# Patient Record
Sex: Female | Born: 1955 | Race: Black or African American | Hispanic: No | Smoking: Never smoker
Health system: Southern US, Community
[De-identification: ages and names within clinical notes are randomized; demographics above are authoritative.]

## PROBLEM LIST (undated history)

## (undated) DIAGNOSIS — E119 Type 2 diabetes mellitus without complications: Secondary | ICD-10-CM

## (undated) DIAGNOSIS — I10 Essential (primary) hypertension: Secondary | ICD-10-CM

## (undated) DIAGNOSIS — E785 Hyperlipidemia, unspecified: Secondary | ICD-10-CM

## (undated) DIAGNOSIS — I219 Acute myocardial infarction, unspecified: Secondary | ICD-10-CM

## (undated) DIAGNOSIS — N289 Disorder of kidney and ureter, unspecified: Secondary | ICD-10-CM

## (undated) HISTORY — PX: CARDIAC SURGERY: SHX584

---

## 2009-06-07 ENCOUNTER — Inpatient Hospital Stay (HOSPITAL_COMMUNITY): Admission: EM | Admit: 2009-06-07 | Discharge: 2009-06-15 | Payer: Self-pay | Admitting: Emergency Medicine

## 2009-06-07 ENCOUNTER — Encounter: Payer: Self-pay | Admitting: Cardiovascular Disease

## 2009-06-07 ENCOUNTER — Ambulatory Visit: Payer: Self-pay | Admitting: Cardiovascular Disease

## 2009-06-09 ENCOUNTER — Ambulatory Visit: Payer: Self-pay | Admitting: Cardiothoracic Surgery

## 2009-06-09 ENCOUNTER — Encounter: Payer: Self-pay | Admitting: Cardiothoracic Surgery

## 2009-06-09 ENCOUNTER — Encounter: Payer: Self-pay | Admitting: Cardiovascular Disease

## 2009-06-10 ENCOUNTER — Encounter: Payer: Self-pay | Admitting: Cardiothoracic Surgery

## 2009-06-16 ENCOUNTER — Encounter: Payer: Self-pay | Admitting: Cardiovascular Disease

## 2009-07-09 ENCOUNTER — Encounter: Admission: RE | Admit: 2009-07-09 | Discharge: 2009-07-09 | Payer: Self-pay | Admitting: Cardiothoracic Surgery

## 2009-07-09 ENCOUNTER — Ambulatory Visit: Payer: Self-pay | Admitting: Cardiothoracic Surgery

## 2009-07-09 ENCOUNTER — Encounter: Payer: Self-pay | Admitting: Cardiovascular Disease

## 2009-07-21 DIAGNOSIS — I2510 Atherosclerotic heart disease of native coronary artery without angina pectoris: Secondary | ICD-10-CM | POA: Insufficient documentation

## 2009-07-22 ENCOUNTER — Encounter: Payer: Self-pay | Admitting: Physician Assistant

## 2009-07-22 ENCOUNTER — Ambulatory Visit: Payer: Self-pay | Admitting: Internal Medicine

## 2009-07-22 DIAGNOSIS — D500 Iron deficiency anemia secondary to blood loss (chronic): Secondary | ICD-10-CM

## 2009-07-22 DIAGNOSIS — I2589 Other forms of chronic ischemic heart disease: Secondary | ICD-10-CM | POA: Insufficient documentation

## 2009-07-22 DIAGNOSIS — E119 Type 2 diabetes mellitus without complications: Secondary | ICD-10-CM

## 2009-07-23 ENCOUNTER — Telehealth: Payer: Self-pay | Admitting: Cardiovascular Disease

## 2009-07-27 ENCOUNTER — Encounter (HOSPITAL_COMMUNITY): Admission: RE | Admit: 2009-07-27 | Discharge: 2009-08-26 | Payer: Self-pay | Admitting: Cardiovascular Disease

## 2009-07-31 ENCOUNTER — Emergency Department (HOSPITAL_COMMUNITY): Admission: EM | Admit: 2009-07-31 | Discharge: 2009-07-31 | Payer: Self-pay | Admitting: Emergency Medicine

## 2009-08-03 ENCOUNTER — Telehealth: Payer: Self-pay | Admitting: Cardiovascular Disease

## 2009-08-06 ENCOUNTER — Telehealth: Payer: Self-pay | Admitting: Cardiovascular Disease

## 2009-08-13 ENCOUNTER — Telehealth: Payer: Self-pay | Admitting: Cardiovascular Disease

## 2009-09-04 ENCOUNTER — Encounter: Payer: Self-pay | Admitting: Cardiovascular Disease

## 2009-09-24 ENCOUNTER — Ambulatory Visit (HOSPITAL_COMMUNITY): Admission: RE | Admit: 2009-09-24 | Discharge: 2009-09-24 | Payer: Self-pay | Admitting: Cardiovascular Disease

## 2009-09-24 ENCOUNTER — Ambulatory Visit: Payer: Self-pay | Admitting: Cardiology

## 2009-09-24 ENCOUNTER — Ambulatory Visit: Payer: Self-pay

## 2009-09-24 ENCOUNTER — Ambulatory Visit: Payer: Self-pay | Admitting: Cardiovascular Disease

## 2009-09-24 ENCOUNTER — Encounter: Payer: Self-pay | Admitting: Cardiovascular Disease

## 2010-01-07 ENCOUNTER — Telehealth: Payer: Self-pay | Admitting: Cardiovascular Disease

## 2010-07-15 ENCOUNTER — Emergency Department (HOSPITAL_COMMUNITY): Admission: EM | Admit: 2010-07-15 | Discharge: 2010-07-15 | Payer: Self-pay | Admitting: Emergency Medicine

## 2010-10-16 IMAGING — CR DG CHEST 1V PORT
1 series · 1 of 1 positions shown · non-contrast
Comparison: 06/07/2009

CLINICAL DATA: Respiratory distress.

PORTABLE CHEST - 1 VIEW

[view not recorded]
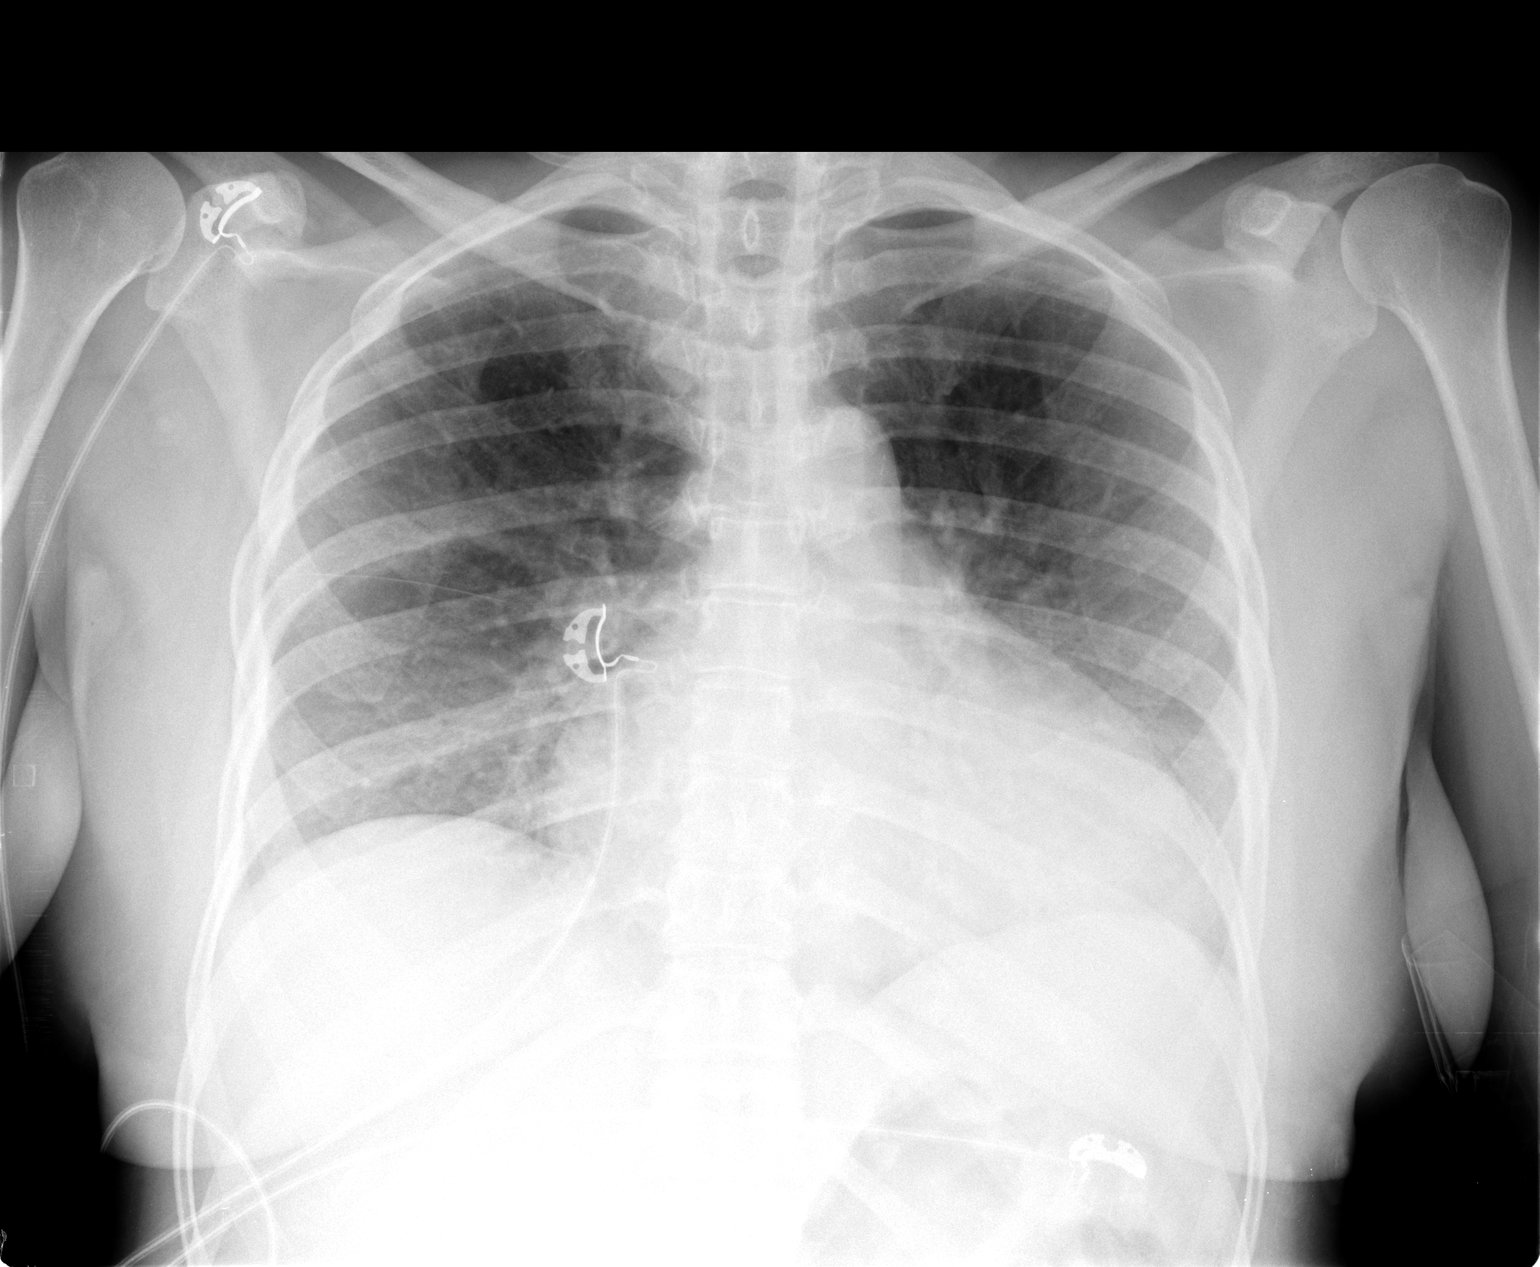

[1 of 1 positions shown; findings below may reference images not displayed]

FINDINGS: There is cardiomegaly with vascular congestion.
Improving airspace disease, likely reflecting improving edema.  No
confluent opacities or effusions.  No acute bony abnormality.
IMPRESSION: Significant improvement in bilateral airspace disease, likely
improving edema.

## 2010-10-18 IMAGING — CR DG CHEST 1V PORT
1 series · 1 of 1 positions shown · non-contrast
Comparison: 06/09/2009

CLINICAL DATA: Post CABG/respiratory distress

PORTABLE CHEST - 1 VIEW

[AP]
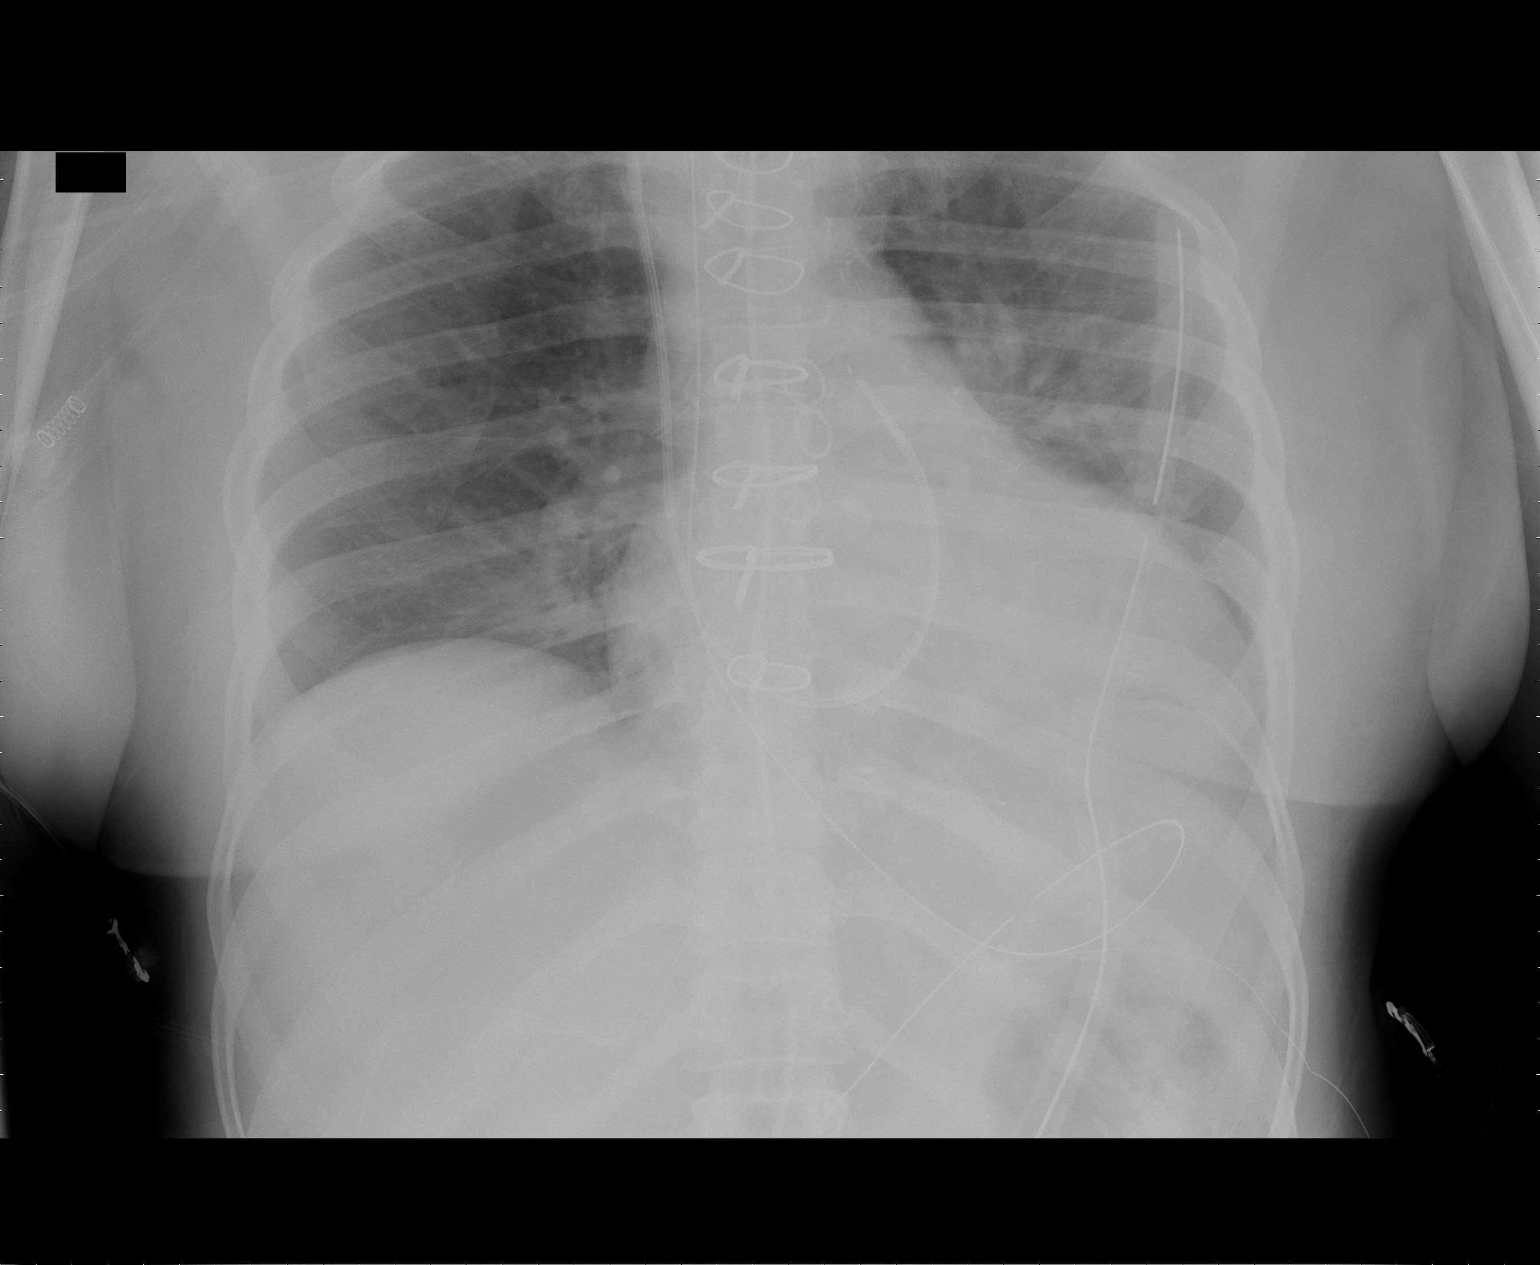

[1 of 1 positions shown; findings below may reference images not displayed]

FINDINGS: Post CABG with customary tubes and lines in place,
including a Swan-Ganz catheter that is positioned in the main
pulmonary artery.

No pneumothorax, pulmonary edema, or major atelectasis.  There is
mild postoperative basilar atelectasis.
IMPRESSION: Overall satisfactory postoperative chest x-ray with mild bibasilar
atelectasis.

## 2010-10-19 IMAGING — CR DG CHEST 1V PORT
1 series · 1 of 1 positions shown · non-contrast
Comparison: 1 day prior

CLINICAL DATA: CABG.  Respiratory distress.

PORTABLE CHEST - 1 VIEW

[AP]
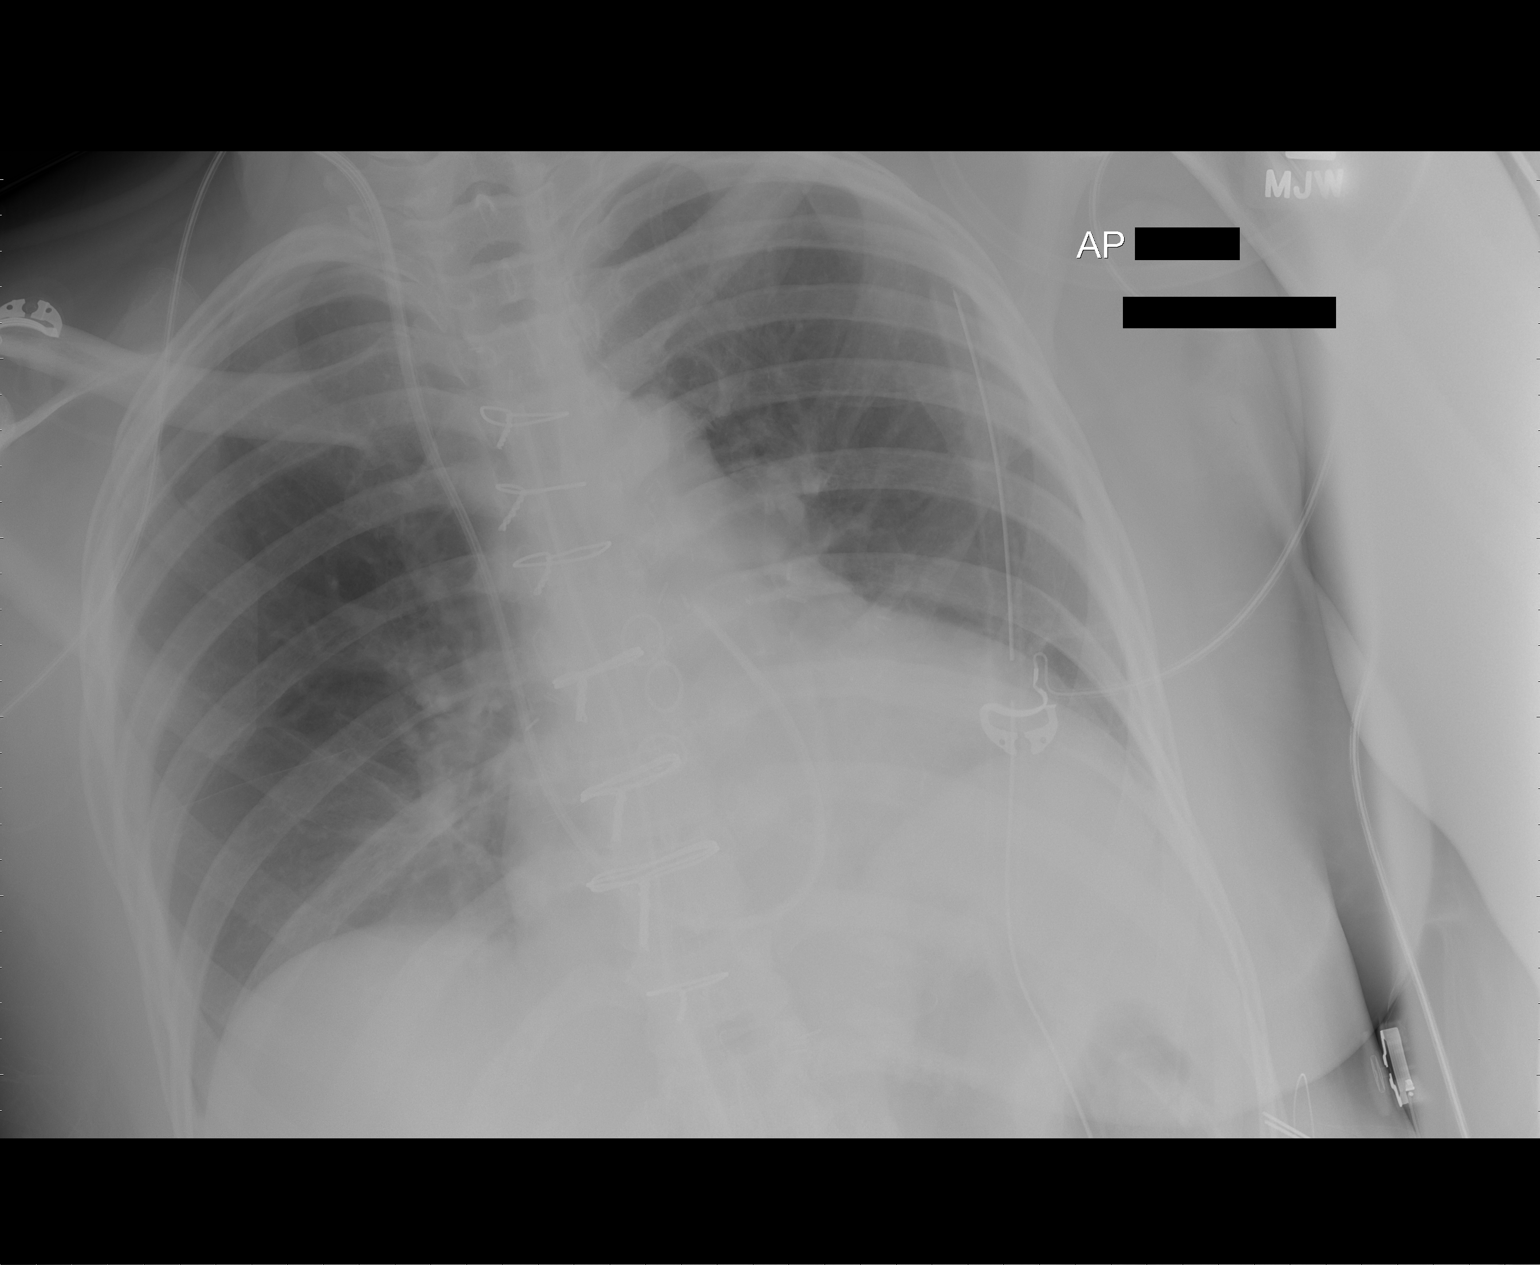

[1 of 1 positions shown; findings below may reference images not displayed]

FINDINGS: Right IJ Cordis sheath remains in place. Prior median
sternotomy. Interval removal of nasogastric tube and extubation.
Mediastinal drain and left-sided chest tube remain in place.

Midline trachea. Normal heart size for level of inspiration.  No
pneumothorax.  Mildly low lung volumes.  Slight improved left base
aeration with mild atelectasis remaining.
IMPRESSION: 1.  Improvement in left base aeration status post extubation.
2.  Left-sided chest tube remaining in place without pneumothorax.

## 2010-10-20 IMAGING — CR DG CHEST 1V PORT
1 series · 1 of 1 positions shown · non-contrast
Comparison: 06/11/2009.

CLINICAL DATA: Respiratory distress.

PORTABLE CHEST - 1 VIEW

[AP]
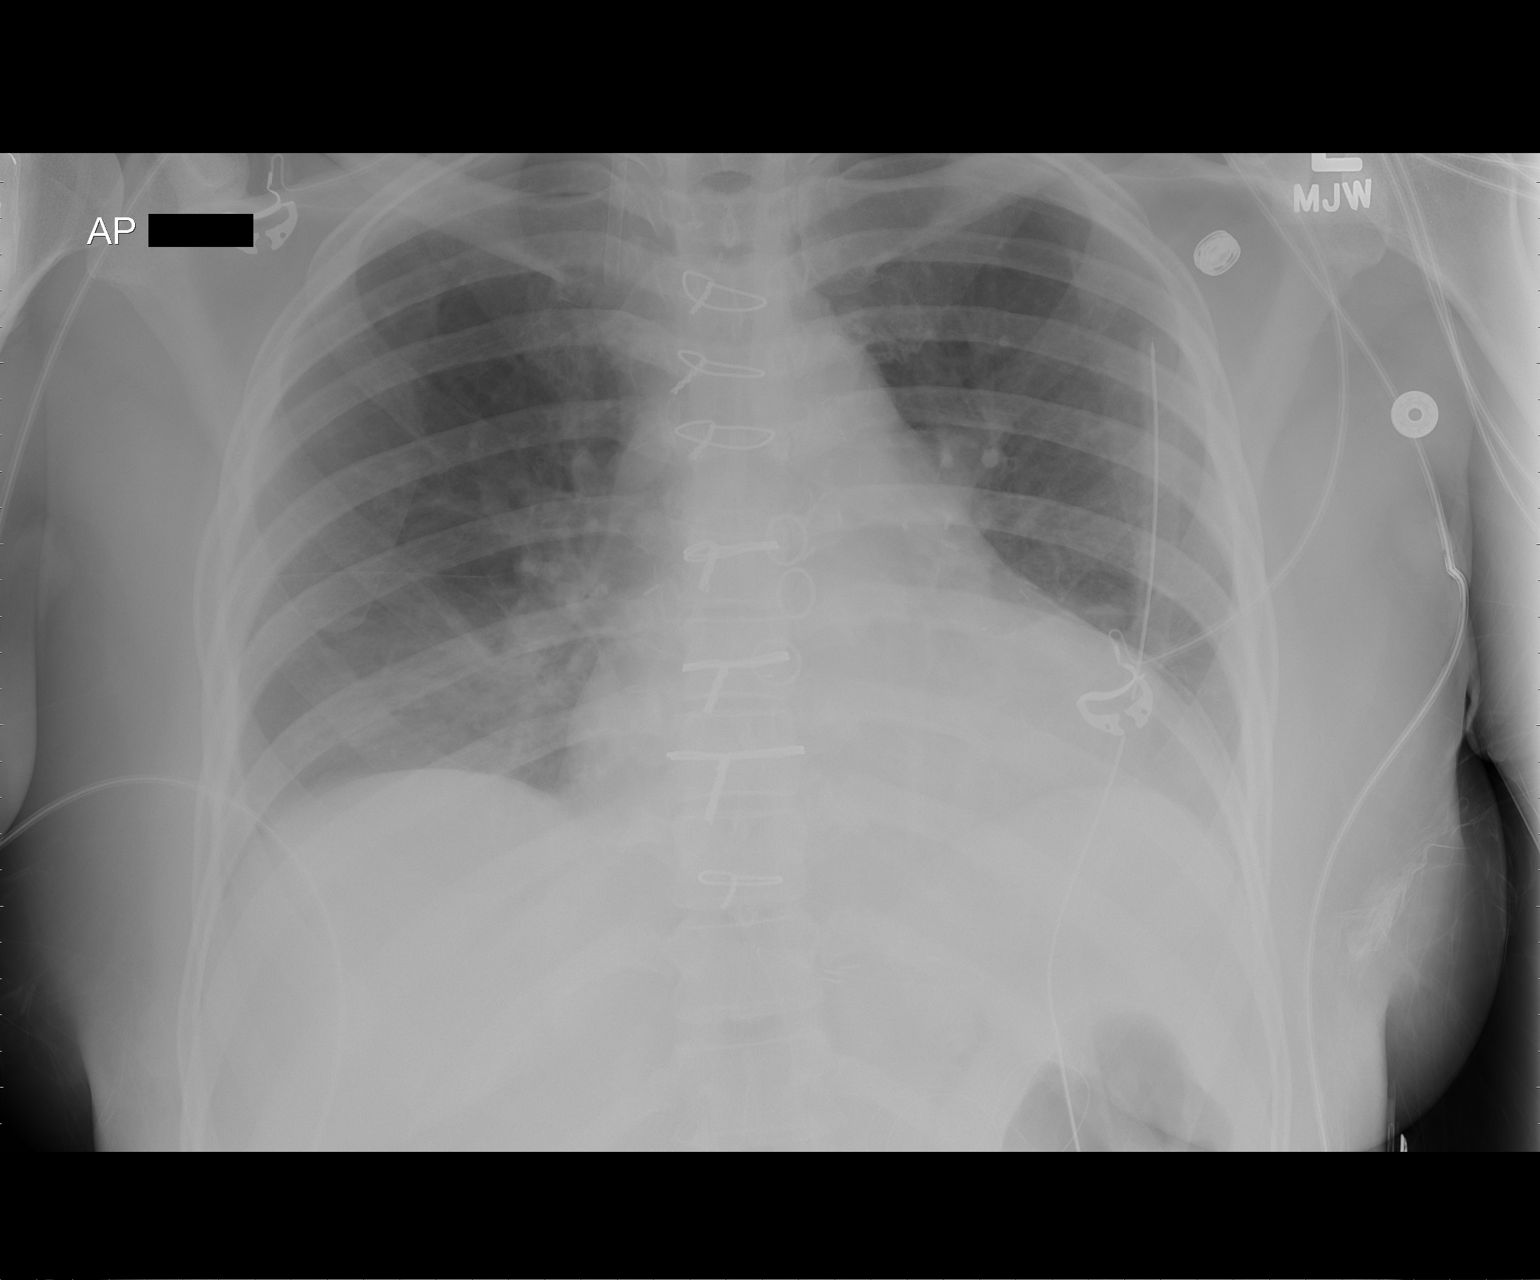

[1 of 1 positions shown; findings below may reference images not displayed]

FINDINGS: 2404 hours.  Lung volumes are low. The cardiopericardial
silhouette is enlarged. Bibasilar atelectasis noted.  Left chest
tube remains in place without evidence for left-sided pneumothorax.
Right IJ sheath noted with interval removal of the pulmonary artery
catheter.  The midline drain has also been pulled since yesterday's
study.
IMPRESSION: Interval removal of multiple support apparatus.

Low volume film with bibasilar atelectasis.

## 2010-10-21 IMAGING — CR DG CHEST 2V
2 series · 2 of 2 positions shown · non-contrast
Comparison: 06/12/2009

CLINICAL DATA: Respiratory distress.  History of CABG procedure.

CHEST - 2 VIEW

[w chest pa]
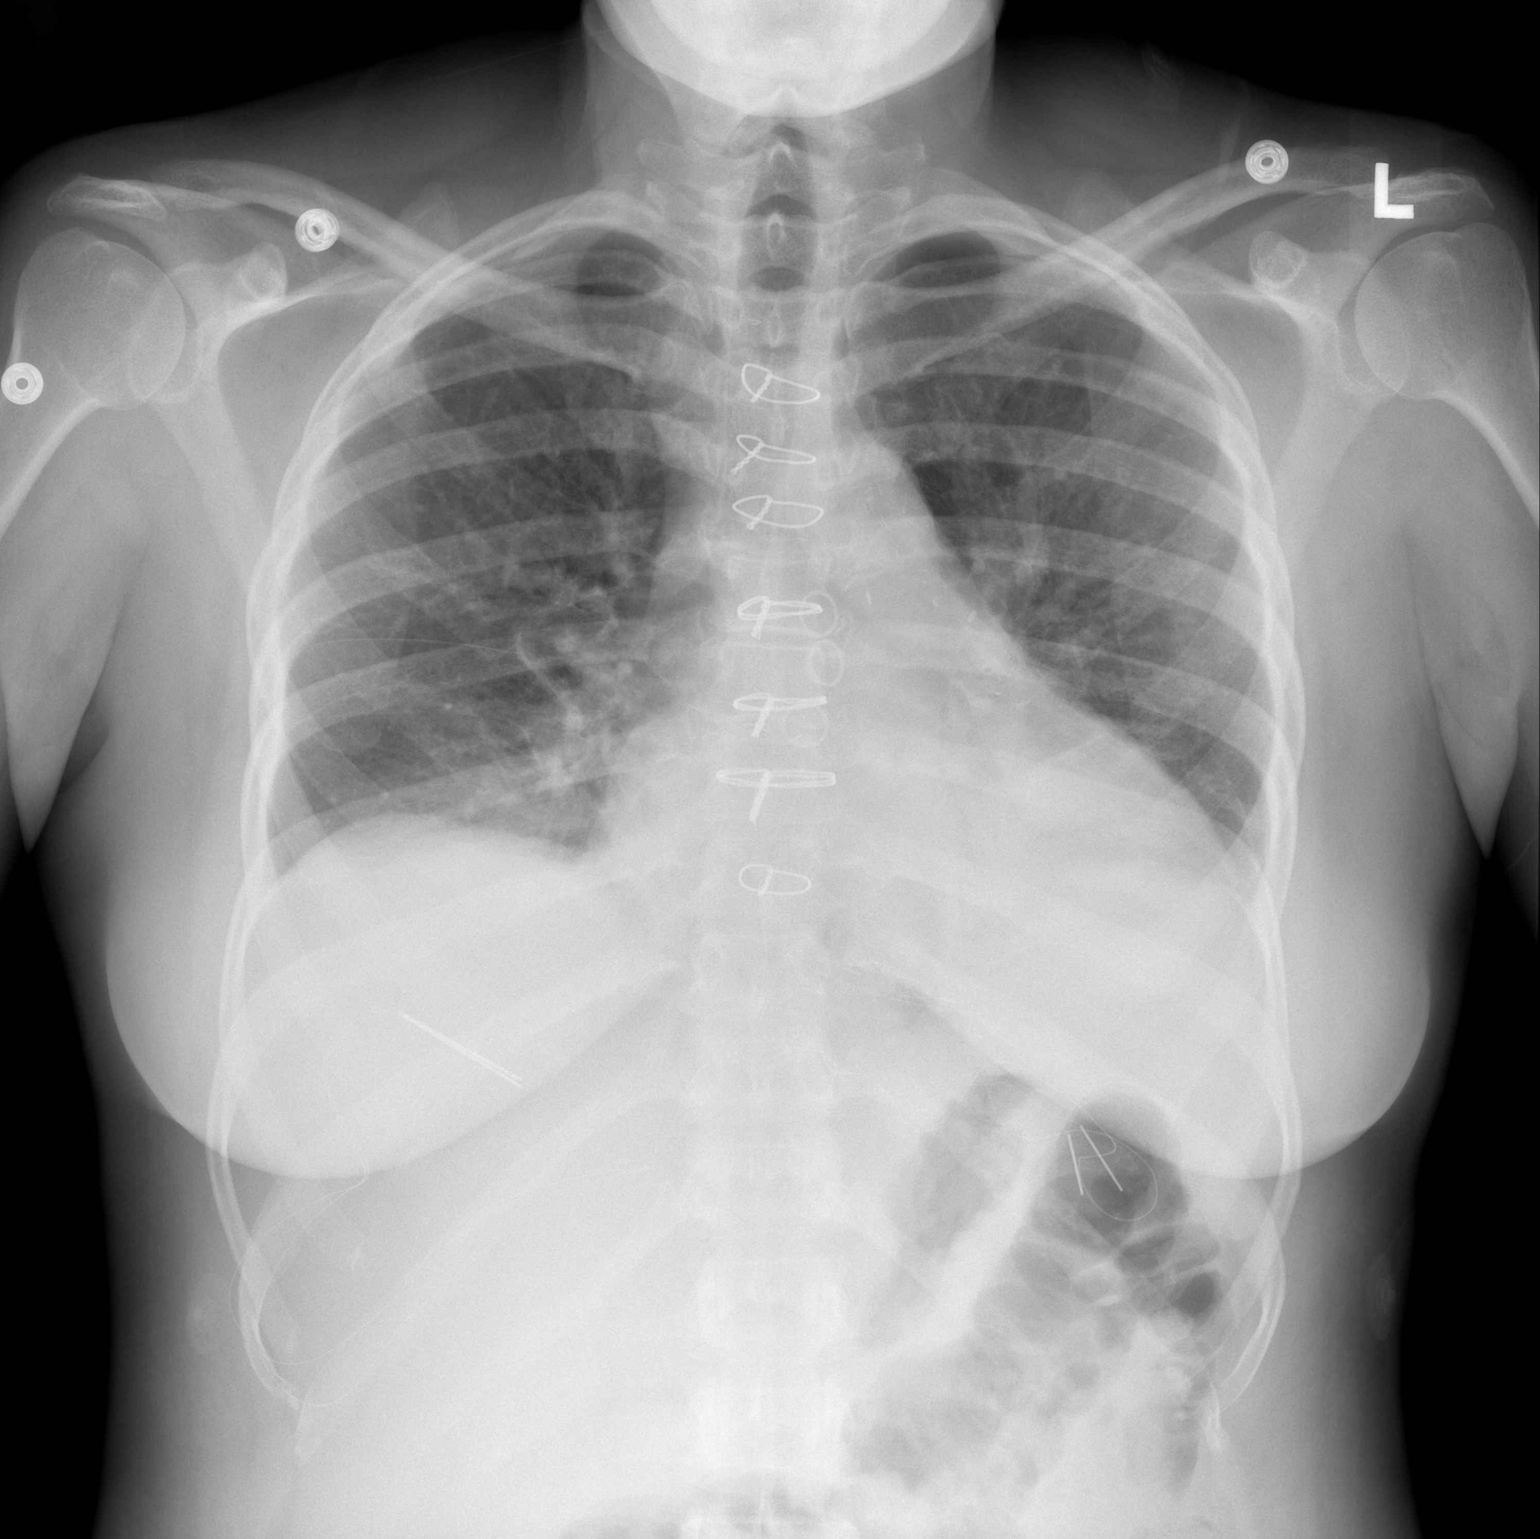

[w chest lat]
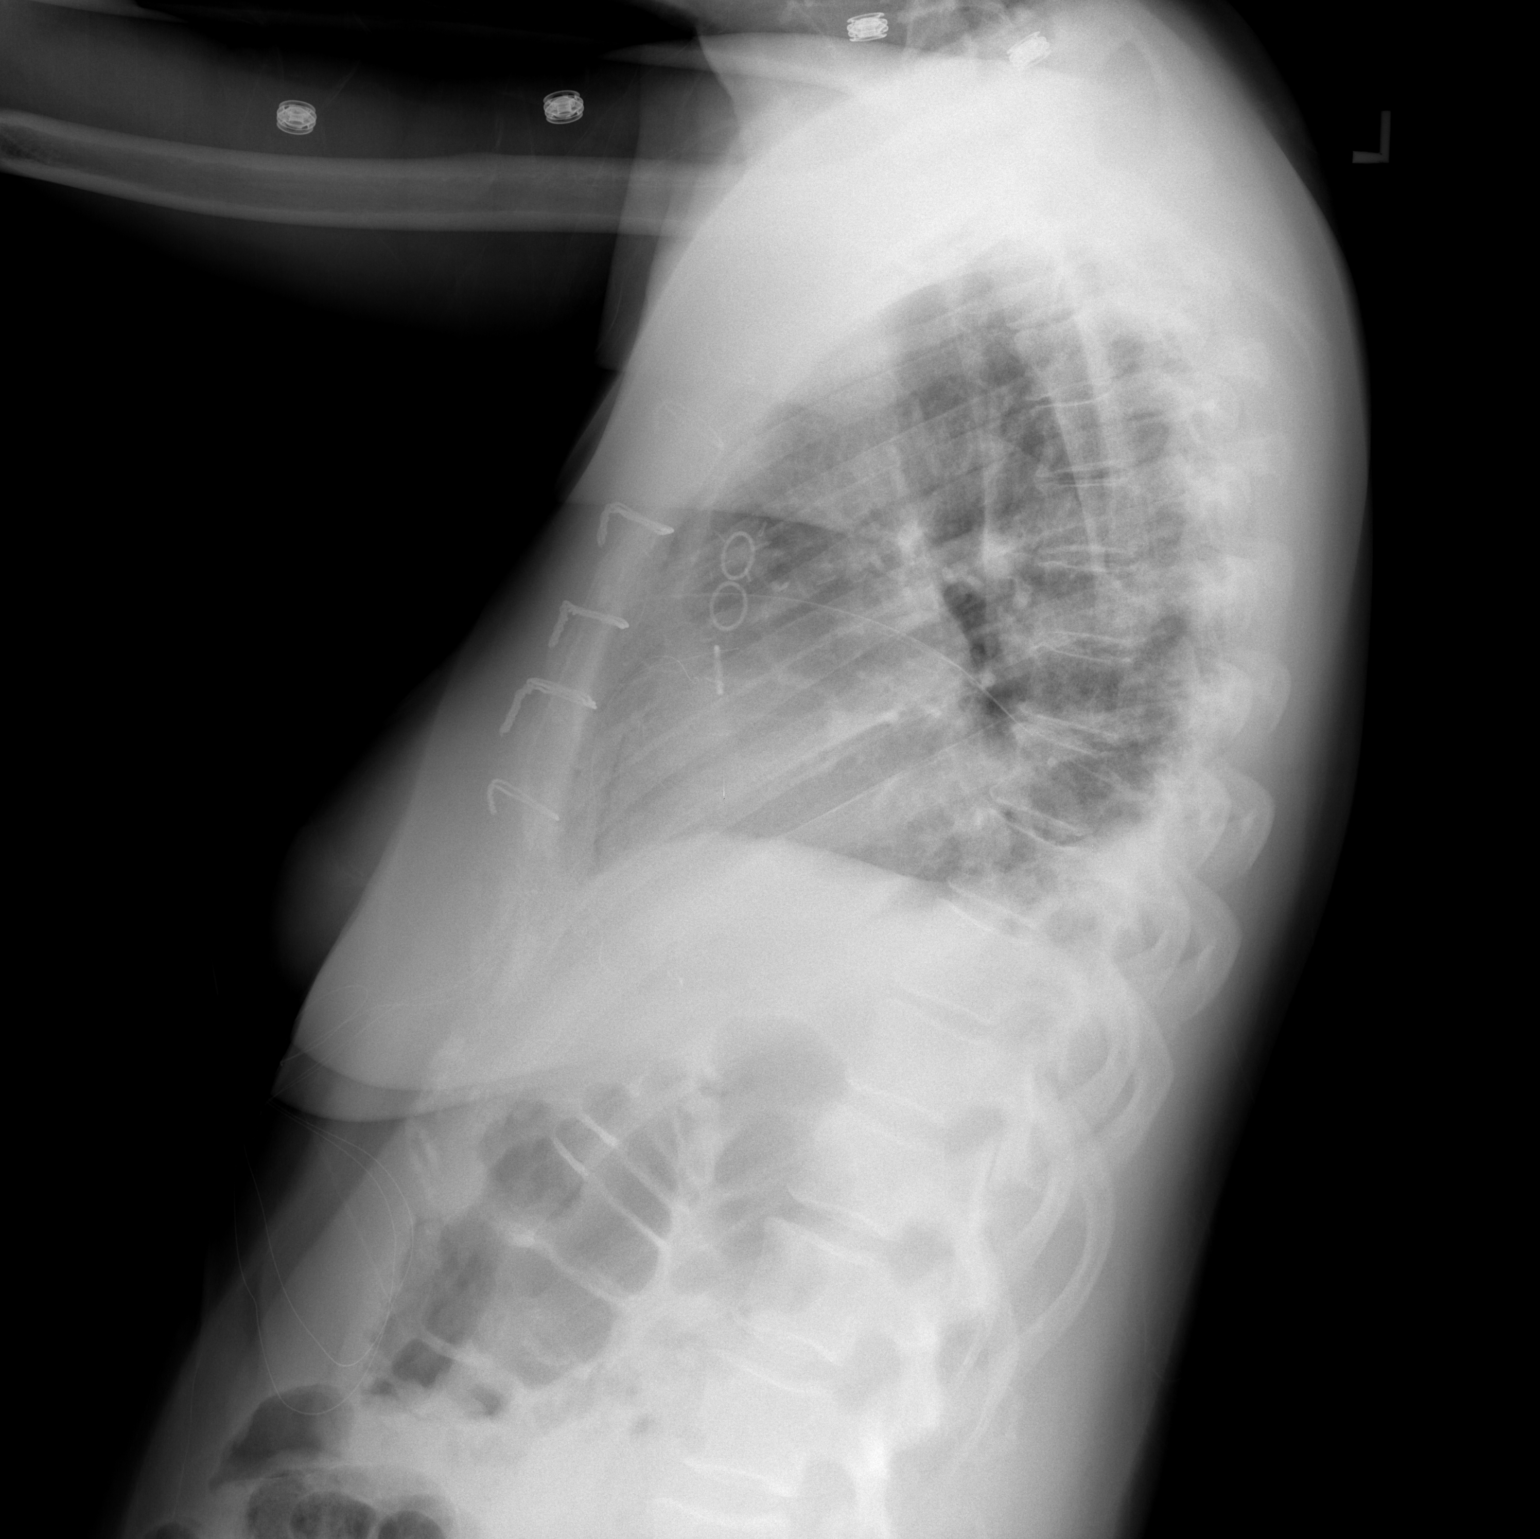

[2 of 2 positions shown; findings below may reference images not displayed]

FINDINGS: Two views of the chest demonstrate removal of the left
chest tube.  Negative for a large pneumothorax.  There are
densities in left lung base suggestive for atelectasis and possibly
a tiny effusion.  Epicardial pacer wires remain in place.  Cardiac
silhouette remains upper limits of normal.  Median sternotomy wires
are present.  Small amount of lucency along the anterior chest
structures may represent postop change.
IMPRESSION: Removal of left chest tube without a large pneumothorax.

Left basilar densities probably represent atelectasis and a small
amount of pleural fluid.

## 2010-11-16 NOTE — Letter (Signed)
Summary: Cardiac Rehab note  Cardiac Rehab note   Imported By: Kassie Mends 09/28/2009 11:43:25  _____________________________________________________________________  External Attachment:    Type:   Image     Comment:   External Document

## 2010-11-16 NOTE — Progress Notes (Signed)
Summary: dental work  Barrister's clerk Note From Other Clinic   Caller: Nurse Fish farm manager of Call: pt having dental work. does she need premeds and to hold any meds for procedure Of[facility] [phone] fax [phone] ATTN: MICHELLE  Initial call taken by: Edman Circle,  January 07, 2010 10:30 AM  Follow-up for Phone Call        no pre-meds required Deliah Goody, RN  January 07, 2010 12:04 PM

## 2010-12-28 ENCOUNTER — Telehealth (INDEPENDENT_AMBULATORY_CARE_PROVIDER_SITE_OTHER): Payer: Self-pay | Admitting: *Deleted

## 2011-01-04 NOTE — Progress Notes (Signed)
  Phone Note Outgoing Call Call back at [facility] Phone [phone]   Call placed by: Carolan Clines Action Taken: Phone Call Completed Details for Reason: [facility]-Screening Summary of Call: contacted pt for screening form and pt refused to have procedure performed. refusal sent to pt refering PCM

## 2011-01-20 LAB — BASIC METABOLIC PANEL
BUN: 32 mg/dL — ABNORMAL HIGH (ref 6–23)
Chloride: 103 mEq/L (ref 96–112)
Potassium: 5.1 mEq/L (ref 3.5–5.1)
Sodium: 138 mEq/L (ref 135–145)

## 2011-01-20 LAB — DIFFERENTIAL
Eosinophils Absolute: 0.1 10*3/uL (ref 0.0–0.7)
Eosinophils Relative: 2 % (ref 0–5)
Lymphs Abs: 1.6 10*3/uL (ref 0.7–4.0)
Monocytes Absolute: 0.4 10*3/uL (ref 0.1–1.0)
Monocytes Relative: 6 % (ref 3–12)

## 2011-01-20 LAB — CBC
HCT: 33.3 % — ABNORMAL LOW (ref 36.0–46.0)
Hemoglobin: 11.1 g/dL — ABNORMAL LOW (ref 12.0–15.0)
MCV: 78.4 fL (ref 78.0–100.0)
Platelets: 182 10*3/uL (ref 150–400)
RBC: 4.24 MIL/uL (ref 3.87–5.11)
WBC: 6.5 10*3/uL (ref 4.0–10.5)

## 2011-01-20 LAB — POCT CARDIAC MARKERS: Troponin i, poc: <0.05 ng/mL (ref 0.00–0.09)

## 2011-01-22 LAB — CBC
HCT: 23.7 % — ABNORMAL LOW (ref 36.0–46.0)
HCT: 24.2 % — ABNORMAL LOW (ref 36.0–46.0)
HCT: 25.5 % — ABNORMAL LOW (ref 36.0–46.0)
HCT: 26.5 % — ABNORMAL LOW (ref 36.0–46.0)
HCT: 28 % — ABNORMAL LOW (ref 36.0–46.0)
HCT: 29.4 % — ABNORMAL LOW (ref 36.0–46.0)
HCT: 31.5 % — ABNORMAL LOW (ref 36.0–46.0)
HCT: 32 % — ABNORMAL LOW (ref 36.0–46.0)
HCT: 39.7 % (ref 36.0–46.0)
HCT: 43.8 % (ref 36.0–46.0)
Hemoglobin: 10.6 g/dL — ABNORMAL LOW (ref 12.0–15.0)
Hemoglobin: 10.6 g/dL — ABNORMAL LOW (ref 12.0–15.0)
Hemoglobin: 12.1 g/dL (ref 12.0–15.0)
Hemoglobin: 14.5 g/dL (ref 12.0–15.0)
Hemoglobin: 7.9 g/dL — CL (ref 12.0–15.0)
Hemoglobin: 8.1 g/dL — ABNORMAL LOW (ref 12.0–15.0)
Hemoglobin: 8.5 g/dL — ABNORMAL LOW (ref 12.0–15.0)
Hemoglobin: 8.8 g/dL — ABNORMAL LOW (ref 12.0–15.0)
Hemoglobin: 9.2 g/dL — ABNORMAL LOW (ref 12.0–15.0)
Hemoglobin: 9.7 g/dL — ABNORMAL LOW (ref 12.0–15.0)
MCHC: 32.9 g/dL (ref 30.0–36.0)
MCHC: 33 g/dL (ref 30.0–36.0)
MCHC: 33 g/dL (ref 30.0–36.0)
MCHC: 33.1 g/dL (ref 30.0–36.0)
MCHC: 33.3 g/dL (ref 30.0–36.0)
MCHC: 33.3 g/dL (ref 30.0–36.0)
MCHC: 33.4 g/dL (ref 30.0–36.0)
MCHC: 33.4 g/dL (ref 30.0–36.0)
MCHC: 33.4 g/dL (ref 30.0–36.0)
MCHC: 33.5 g/dL (ref 30.0–36.0)
MCHC: 33.6 g/dL (ref 30.0–36.0)
MCV: 83 fL (ref 78.0–100.0)
MCV: 83.3 fL (ref 78.0–100.0)
MCV: 83.5 fL (ref 78.0–100.0)
MCV: 83.8 fL (ref 78.0–100.0)
MCV: 83.8 fL (ref 78.0–100.0)
MCV: 84 fL (ref 78.0–100.0)
MCV: 84.1 fL (ref 78.0–100.0)
MCV: 84.3 fL (ref 78.0–100.0)
MCV: 84.4 fL (ref 78.0–100.0)
MCV: 84.8 fL (ref 78.0–100.0)
Platelets: 107 10*3/uL — ABNORMAL LOW (ref 150–400)
Platelets: 115 10*3/uL — ABNORMAL LOW (ref 150–400)
Platelets: 116 10*3/uL — ABNORMAL LOW (ref 150–400)
Platelets: 130 10*3/uL — ABNORMAL LOW (ref 150–400)
Platelets: 158 10*3/uL (ref 150–400)
Platelets: 218 10*3/uL (ref 150–400)
Platelets: 233 K/uL (ref 150–400)
Platelets: 86 10*3/uL — ABNORMAL LOW (ref 150–400)
Platelets: 98 10*3/uL — ABNORMAL LOW (ref 150–400)
Platelets: 98 10*3/uL — ABNORMAL LOW (ref 150–400)
RBC: 2.81 MIL/uL — ABNORMAL LOW (ref 3.87–5.11)
RBC: 2.89 MIL/uL — ABNORMAL LOW (ref 3.87–5.11)
RBC: 3.02 MIL/uL — ABNORMAL LOW (ref 3.87–5.11)
RBC: 3.15 MIL/uL — ABNORMAL LOW (ref 3.87–5.11)
RBC: 3.33 MIL/uL — ABNORMAL LOW (ref 3.87–5.11)
RBC: 3.46 MIL/uL — ABNORMAL LOW (ref 3.87–5.11)
RBC: 3.8 MIL/uL — ABNORMAL LOW (ref 3.87–5.11)
RBC: 3.82 MIL/uL — ABNORMAL LOW (ref 3.87–5.11)
RBC: 4.46 MIL/uL (ref 3.87–5.11)
RBC: 4.73 MIL/uL (ref 3.87–5.11)
RBC: 5.24 MIL/uL — ABNORMAL HIGH (ref 3.87–5.11)
RDW: 13.9 % (ref 11.5–15.5)
RDW: 14.1 % (ref 11.5–15.5)
RDW: 14.2 % (ref 11.5–15.5)
RDW: 14.4 % (ref 11.5–15.5)
RDW: 14.5 % (ref 11.5–15.5)
RDW: 14.6 % (ref 11.5–15.5)
RDW: 14.6 % (ref 11.5–15.5)
RDW: 14.9 % (ref 11.5–15.5)
RDW: 15 % (ref 11.5–15.5)
RDW: 15.1 % (ref 11.5–15.5)
WBC: 10.2 10*3/uL (ref 4.0–10.5)
WBC: 11.9 10*3/uL — ABNORMAL HIGH (ref 4.0–10.5)
WBC: 5.4 10*3/uL (ref 4.0–10.5)
WBC: 6.4 10*3/uL (ref 4.0–10.5)
WBC: 7.5 K/uL (ref 4.0–10.5)
WBC: 7.7 10*3/uL (ref 4.0–10.5)
WBC: 8 10*3/uL (ref 4.0–10.5)
WBC: 8 10*3/uL (ref 4.0–10.5)
WBC: 8 10*3/uL (ref 4.0–10.5)
WBC: 8.6 10*3/uL (ref 4.0–10.5)
WBC: 8.8 10*3/uL (ref 4.0–10.5)

## 2011-01-22 LAB — GLUCOSE, CAPILLARY
Glucose-Capillary: 102 mg/dL — ABNORMAL HIGH (ref 70–99)
Glucose-Capillary: 112 mg/dL — ABNORMAL HIGH (ref 70–99)
Glucose-Capillary: 123 mg/dL — ABNORMAL HIGH (ref 70–99)
Glucose-Capillary: 123 mg/dL — ABNORMAL HIGH (ref 70–99)
Glucose-Capillary: 124 mg/dL — ABNORMAL HIGH (ref 70–99)
Glucose-Capillary: 128 mg/dL — ABNORMAL HIGH (ref 70–99)
Glucose-Capillary: 128 mg/dL — ABNORMAL HIGH (ref 70–99)
Glucose-Capillary: 130 mg/dL — ABNORMAL HIGH (ref 70–99)
Glucose-Capillary: 131 mg/dL — ABNORMAL HIGH (ref 70–99)
Glucose-Capillary: 134 mg/dL — ABNORMAL HIGH (ref 70–99)
Glucose-Capillary: 135 mg/dL — ABNORMAL HIGH (ref 70–99)
Glucose-Capillary: 136 mg/dL — ABNORMAL HIGH (ref 70–99)
Glucose-Capillary: 137 mg/dL — ABNORMAL HIGH (ref 70–99)
Glucose-Capillary: 140 mg/dL — ABNORMAL HIGH (ref 70–99)
Glucose-Capillary: 145 mg/dL — ABNORMAL HIGH (ref 70–99)
Glucose-Capillary: 146 mg/dL — ABNORMAL HIGH (ref 70–99)
Glucose-Capillary: 148 mg/dL — ABNORMAL HIGH (ref 70–99)
Glucose-Capillary: 150 mg/dL — ABNORMAL HIGH (ref 70–99)
Glucose-Capillary: 152 mg/dL — ABNORMAL HIGH (ref 70–99)
Glucose-Capillary: 152 mg/dL — ABNORMAL HIGH (ref 70–99)
Glucose-Capillary: 153 mg/dL — ABNORMAL HIGH (ref 70–99)
Glucose-Capillary: 153 mg/dL — ABNORMAL HIGH (ref 70–99)
Glucose-Capillary: 154 mg/dL — ABNORMAL HIGH (ref 70–99)
Glucose-Capillary: 156 mg/dL — ABNORMAL HIGH (ref 70–99)
Glucose-Capillary: 159 mg/dL — ABNORMAL HIGH (ref 70–99)
Glucose-Capillary: 160 mg/dL — ABNORMAL HIGH (ref 70–99)
Glucose-Capillary: 162 mg/dL — ABNORMAL HIGH (ref 70–99)
Glucose-Capillary: 170 mg/dL — ABNORMAL HIGH (ref 70–99)
Glucose-Capillary: 183 mg/dL — ABNORMAL HIGH (ref 70–99)
Glucose-Capillary: 183 mg/dL — ABNORMAL HIGH (ref 70–99)
Glucose-Capillary: 184 mg/dL — ABNORMAL HIGH (ref 70–99)
Glucose-Capillary: 185 mg/dL — ABNORMAL HIGH (ref 70–99)
Glucose-Capillary: 190 mg/dL — ABNORMAL HIGH (ref 70–99)
Glucose-Capillary: 195 mg/dL — ABNORMAL HIGH (ref 70–99)
Glucose-Capillary: 197 mg/dL — ABNORMAL HIGH (ref 70–99)
Glucose-Capillary: 198 mg/dL — ABNORMAL HIGH (ref 70–99)
Glucose-Capillary: 200 mg/dL — ABNORMAL HIGH (ref 70–99)
Glucose-Capillary: 205 mg/dL — ABNORMAL HIGH (ref 70–99)
Glucose-Capillary: 206 mg/dL — ABNORMAL HIGH (ref 70–99)
Glucose-Capillary: 207 mg/dL — ABNORMAL HIGH (ref 70–99)
Glucose-Capillary: 211 mg/dL — ABNORMAL HIGH (ref 70–99)
Glucose-Capillary: 220 mg/dL — ABNORMAL HIGH (ref 70–99)
Glucose-Capillary: 232 mg/dL — ABNORMAL HIGH (ref 70–99)
Glucose-Capillary: 235 mg/dL — ABNORMAL HIGH (ref 70–99)
Glucose-Capillary: 236 mg/dL — ABNORMAL HIGH (ref 70–99)
Glucose-Capillary: 237 mg/dL — ABNORMAL HIGH (ref 70–99)
Glucose-Capillary: 280 mg/dL — ABNORMAL HIGH (ref 70–99)
Glucose-Capillary: 288 mg/dL — ABNORMAL HIGH (ref 70–99)
Glucose-Capillary: 295 mg/dL — ABNORMAL HIGH (ref 70–99)
Glucose-Capillary: 314 mg/dL — ABNORMAL HIGH (ref 70–99)
Glucose-Capillary: 389 mg/dL — ABNORMAL HIGH (ref 70–99)
Glucose-Capillary: 450 mg/dL — ABNORMAL HIGH (ref 70–99)
Glucose-Capillary: 544 mg/dL (ref 70–99)
Glucose-Capillary: 63 mg/dL — ABNORMAL LOW (ref 70–99)
Glucose-Capillary: 78 mg/dL (ref 70–99)
Glucose-Capillary: 82 mg/dL (ref 70–99)
Glucose-Capillary: 91 mg/dL (ref 70–99)
Glucose-Capillary: 96 mg/dL (ref 70–99)

## 2011-01-22 LAB — POCT I-STAT 4, (NA,K, GLUC, HGB,HCT)
Glucose, Bld: 113 mg/dL — ABNORMAL HIGH (ref 70–99)
Glucose, Bld: 138 mg/dL — ABNORMAL HIGH (ref 70–99)
Glucose, Bld: 143 mg/dL — ABNORMAL HIGH (ref 70–99)
Glucose, Bld: 144 mg/dL — ABNORMAL HIGH (ref 70–99)
Glucose, Bld: 149 mg/dL — ABNORMAL HIGH (ref 70–99)
Glucose, Bld: 183 mg/dL — ABNORMAL HIGH (ref 70–99)
Glucose, Bld: 220 mg/dL — ABNORMAL HIGH (ref 70–99)
HCT: 22 % — ABNORMAL LOW (ref 36.0–46.0)
HCT: 22 % — ABNORMAL LOW (ref 36.0–46.0)
HCT: 23 % — ABNORMAL LOW (ref 36.0–46.0)
HCT: 25 % — ABNORMAL LOW (ref 36.0–46.0)
HCT: 30 % — ABNORMAL LOW (ref 36.0–46.0)
HCT: 33 % — ABNORMAL LOW (ref 36.0–46.0)
HCT: 33 % — ABNORMAL LOW (ref 36.0–46.0)
Hemoglobin: 10.2 g/dL — ABNORMAL LOW (ref 12.0–15.0)
Hemoglobin: 11.2 g/dL — ABNORMAL LOW (ref 12.0–15.0)
Hemoglobin: 11.2 g/dL — ABNORMAL LOW (ref 12.0–15.0)
Hemoglobin: 7.5 g/dL — CL (ref 12.0–15.0)
Hemoglobin: 7.5 g/dL — CL (ref 12.0–15.0)
Hemoglobin: 7.8 g/dL — CL (ref 12.0–15.0)
Hemoglobin: 8.5 g/dL — ABNORMAL LOW (ref 12.0–15.0)
Potassium: 3 mEq/L — ABNORMAL LOW (ref 3.5–5.1)
Potassium: 3.3 mEq/L — ABNORMAL LOW (ref 3.5–5.1)
Potassium: 3.3 meq/L — ABNORMAL LOW (ref 3.5–5.1)
Potassium: 3.6 mEq/L (ref 3.5–5.1)
Potassium: 3.7 meq/L (ref 3.5–5.1)
Potassium: 3.8 mEq/L (ref 3.5–5.1)
Potassium: 3.8 meq/L (ref 3.5–5.1)
Sodium: 134 meq/L — ABNORMAL LOW (ref 135–145)
Sodium: 136 meq/L (ref 135–145)
Sodium: 138 meq/L (ref 135–145)
Sodium: 139 mEq/L (ref 135–145)
Sodium: 139 meq/L (ref 135–145)
Sodium: 140 mEq/L (ref 135–145)
Sodium: 143 mEq/L (ref 135–145)

## 2011-01-22 LAB — BASIC METABOLIC PANEL
BUN: 10 mg/dL (ref 6–23)
BUN: 13 mg/dL (ref 6–23)
BUN: 14 mg/dL (ref 6–23)
BUN: 15 mg/dL (ref 6–23)
BUN: 15 mg/dL (ref 6–23)
BUN: 15 mg/dL (ref 6–23)
BUN: 15 mg/dL (ref 6–23)
BUN: 9 mg/dL (ref 6–23)
CO2: 23 mEq/L (ref 19–32)
CO2: 26 mEq/L (ref 19–32)
CO2: 27 mEq/L (ref 19–32)
CO2: 27 mEq/L (ref 19–32)
CO2: 27 mEq/L (ref 19–32)
CO2: 27 mEq/L (ref 19–32)
CO2: 28 mEq/L (ref 19–32)
CO2: 29 mEq/L (ref 19–32)
Calcium: 8 mg/dL — ABNORMAL LOW (ref 8.4–10.5)
Calcium: 8 mg/dL — ABNORMAL LOW (ref 8.4–10.5)
Calcium: 8.1 mg/dL — ABNORMAL LOW (ref 8.4–10.5)
Calcium: 8.2 mg/dL — ABNORMAL LOW (ref 8.4–10.5)
Calcium: 8.2 mg/dL — ABNORMAL LOW (ref 8.4–10.5)
Calcium: 9.1 mg/dL (ref 8.4–10.5)
Calcium: 9.1 mg/dL (ref 8.4–10.5)
Calcium: 9.3 mg/dL (ref 8.4–10.5)
Chloride: 100 mEq/L (ref 96–112)
Chloride: 100 mEq/L (ref 96–112)
Chloride: 101 mEq/L (ref 96–112)
Chloride: 101 mEq/L (ref 96–112)
Chloride: 104 mEq/L (ref 96–112)
Chloride: 104 mEq/L (ref 96–112)
Chloride: 104 mEq/L (ref 96–112)
Chloride: 105 mEq/L (ref 96–112)
Chloride: 105 mEq/L (ref 96–112)
Chloride: 105 mEq/L (ref 96–112)
Chloride: 106 mEq/L (ref 96–112)
Chloride: 108 mEq/L (ref 96–112)
Chloride: 110 mEq/L (ref 96–112)
Creatinine, Ser: 0.72 mg/dL (ref 0.4–1.2)
Creatinine, Ser: 0.74 mg/dL (ref 0.4–1.2)
Creatinine, Ser: 0.76 mg/dL (ref 0.4–1.2)
Creatinine, Ser: 0.78 mg/dL (ref 0.4–1.2)
Creatinine, Ser: 0.79 mg/dL (ref 0.4–1.2)
Creatinine, Ser: 0.8 mg/dL (ref 0.4–1.2)
Creatinine, Ser: 0.83 mg/dL (ref 0.4–1.2)
Creatinine, Ser: 0.9 mg/dL (ref 0.4–1.2)
Creatinine, Ser: 0.94 mg/dL (ref 0.4–1.2)
Creatinine, Ser: 0.95 mg/dL (ref 0.4–1.2)
Creatinine, Ser: 0.97 mg/dL (ref 0.4–1.2)
GFR calc Af Amer: >60 mL/min (ref >60)
GFR calc Af Amer: >60 mL/min (ref >60)
GFR calc Af Amer: >60 mL/min (ref >60)
GFR calc Af Amer: >60 mL/min (ref >60)
GFR calc Af Amer: >60 mL/min (ref >60)
GFR calc Af Amer: >60 mL/min (ref >60)
GFR calc Af Amer: >60 mL/min (ref >60)
GFR calc Af Amer: >60 mL/min (ref >60)
GFR calc Af Amer: >60 mL/min (ref >60)
GFR calc Af Amer: >60 mL/min (ref >60)
GFR calc non Af Amer: >60 mL/min (ref >60)
GFR calc non Af Amer: >60 mL/min (ref >60)
GFR calc non Af Amer: >60 mL/min (ref >60)
GFR calc non Af Amer: >60 mL/min (ref >60)
GFR calc non Af Amer: >60 mL/min (ref >60)
GFR calc non Af Amer: >60 mL/min (ref >60)
GFR calc non Af Amer: >60 mL/min (ref >60)
GFR calc non Af Amer: >60 mL/min (ref >60)
GFR calc non Af Amer: >60 mL/min (ref >60)
GFR calc non Af Amer: >60 mL/min (ref >60)
Glucose, Bld: 142 mg/dL — ABNORMAL HIGH (ref 70–99)
Glucose, Bld: 155 mg/dL — ABNORMAL HIGH (ref 70–99)
Glucose, Bld: 160 mg/dL — ABNORMAL HIGH (ref 70–99)
Glucose, Bld: 163 mg/dL — ABNORMAL HIGH (ref 70–99)
Glucose, Bld: 165 mg/dL — ABNORMAL HIGH (ref 70–99)
Glucose, Bld: 169 mg/dL — ABNORMAL HIGH (ref 70–99)
Glucose, Bld: 169 mg/dL — ABNORMAL HIGH (ref 70–99)
Glucose, Bld: 176 mg/dL — ABNORMAL HIGH (ref 70–99)
Glucose, Bld: 255 mg/dL — ABNORMAL HIGH (ref 70–99)
Potassium: 3.5 mEq/L (ref 3.5–5.1)
Potassium: 3.6 mEq/L (ref 3.5–5.1)
Potassium: 3.6 mEq/L (ref 3.5–5.1)
Potassium: 3.7 mEq/L (ref 3.5–5.1)
Potassium: 3.7 mEq/L (ref 3.5–5.1)
Potassium: 3.8 mEq/L (ref 3.5–5.1)
Potassium: 3.8 mEq/L (ref 3.5–5.1)
Potassium: 3.9 mEq/L (ref 3.5–5.1)
Potassium: 3.9 mEq/L (ref 3.5–5.1)
Potassium: 4.1 mEq/L (ref 3.5–5.1)
Potassium: 4.2 mEq/L (ref 3.5–5.1)
Sodium: 137 mEq/L (ref 135–145)
Sodium: 138 mEq/L (ref 135–145)
Sodium: 138 mEq/L (ref 135–145)
Sodium: 138 mEq/L (ref 135–145)
Sodium: 139 mEq/L (ref 135–145)
Sodium: 140 mEq/L (ref 135–145)
Sodium: 140 mEq/L (ref 135–145)
Sodium: 141 mEq/L (ref 135–145)
Sodium: 142 mEq/L (ref 135–145)

## 2011-01-22 LAB — POCT I-STAT 3, ART BLOOD GAS (G3+)
Acid-Base Excess: 1 mmol/L (ref 0.0–2.0)
Acid-Base Excess: 1 mmol/L (ref 0.0–2.0)
Acid-Base Excess: 1 mmol/L (ref 0.0–2.0)
Acid-Base Excess: 1 mmol/L (ref 0.0–2.0)
Acid-Base Excess: 2 mmol/L (ref 0.0–2.0)
Acid-Base Excess: 3 mmol/L — ABNORMAL HIGH (ref 0.0–2.0)
Bicarbonate: 24.3 meq/L — ABNORMAL HIGH (ref 20.0–24.0)
Bicarbonate: 24.4 mEq/L — ABNORMAL HIGH (ref 20.0–24.0)
Bicarbonate: 24.6 mEq/L — ABNORMAL HIGH (ref 20.0–24.0)
Bicarbonate: 24.9 meq/L — ABNORMAL HIGH (ref 20.0–24.0)
Bicarbonate: 25.3 mEq/L — ABNORMAL HIGH (ref 20.0–24.0)
Bicarbonate: 25.5 mEq/L — ABNORMAL HIGH (ref 20.0–24.0)
Bicarbonate: 25.5 mEq/L — ABNORMAL HIGH (ref 20.0–24.0)
Bicarbonate: 29.1 meq/L — ABNORMAL HIGH (ref 20.0–24.0)
O2 Saturation: 100 %
O2 Saturation: 100 %
O2 Saturation: 100 %
O2 Saturation: 92 %
O2 Saturation: 92 %
O2 Saturation: 95 %
O2 Saturation: 96 %
O2 Saturation: 99 %
Patient temperature: 35.8
Patient temperature: 36.4
Patient temperature: 37
Patient temperature: 98.6
TCO2: 25 mmol/L (ref 0–100)
TCO2: 25 mmol/L (ref 0–100)
TCO2: 26 mmol/L (ref 0–100)
TCO2: 26 mmol/L (ref 0–100)
TCO2: 27 mmol/L (ref 0–100)
TCO2: 27 mmol/L (ref 0–100)
TCO2: 27 mmol/L (ref 0–100)
TCO2: 31 mmol/L (ref 0–100)
pCO2 arterial: 33.9 mmHg — ABNORMAL LOW (ref 35.0–45.0)
pCO2 arterial: 34.5 mmHg — ABNORMAL LOW (ref 35.0–45.0)
pCO2 arterial: 34.7 mmHg — ABNORMAL LOW (ref 35.0–45.0)
pCO2 arterial: 34.8 mmHg — ABNORMAL LOW (ref 35.0–45.0)
pCO2 arterial: 37.2 mmHg (ref 35.0–45.0)
pCO2 arterial: 38.3 mmHg (ref 35.0–45.0)
pCO2 arterial: 40.5 mmHg (ref 35.0–45.0)
pCO2 arterial: 52.8 mmHg — ABNORMAL HIGH (ref 35.0–45.0)
pH, Arterial: 7.349 — ABNORMAL LOW (ref 7.350–7.400)
pH, Arterial: 7.403 — ABNORMAL HIGH (ref 7.350–7.400)
pH, Arterial: 7.428 — ABNORMAL HIGH (ref 7.350–7.400)
pH, Arterial: 7.444 — ABNORMAL HIGH (ref 7.350–7.400)
pH, Arterial: 7.452 — ABNORMAL HIGH (ref 7.350–7.400)
pH, Arterial: 7.455 — ABNORMAL HIGH (ref 7.350–7.400)
pH, Arterial: 7.456 — ABNORMAL HIGH (ref 7.350–7.400)
pH, Arterial: 7.474 — ABNORMAL HIGH (ref 7.350–7.400)
pO2, Arterial: 138 mmHg — ABNORMAL HIGH (ref 80.0–100.0)
pO2, Arterial: 239 mmHg — ABNORMAL HIGH (ref 80.0–100.0)
pO2, Arterial: 274 mmHg — ABNORMAL HIGH (ref 80.0–100.0)
pO2, Arterial: 374 mmHg — ABNORMAL HIGH (ref 80.0–100.0)
pO2, Arterial: 63 mmHg — ABNORMAL LOW (ref 80.0–100.0)
pO2, Arterial: 68 mmHg — ABNORMAL LOW (ref 80.0–100.0)
pO2, Arterial: 70 mmHg — ABNORMAL LOW (ref 80.0–100.0)
pO2, Arterial: 76 mmHg — ABNORMAL LOW (ref 80.0–100.0)

## 2011-01-22 LAB — POCT I-STAT 3, VENOUS BLOOD GAS (G3P V)
Acid-Base Excess: 3 mmol/L — ABNORMAL HIGH (ref 0.0–2.0)
Acid-base deficit: 2 mmol/L (ref 0.0–2.0)
Bicarbonate: 23.3 mEq/L (ref 20.0–24.0)
Bicarbonate: 28.2 meq/L — ABNORMAL HIGH (ref 20.0–24.0)
O2 Saturation: 76 %
O2 Saturation: 76 %
TCO2: 24 mmol/L (ref 0–100)
TCO2: 30 mmol/L (ref 0–100)
pCO2, Ven: 39.2 mmHg — ABNORMAL LOW (ref 45.0–50.0)
pCO2, Ven: 46.7 mmHg (ref 45.0–50.0)
pH, Ven: 7.382 — ABNORMAL HIGH (ref 7.250–7.300)
pH, Ven: 7.389 — ABNORMAL HIGH (ref 7.250–7.300)
pO2, Ven: 42 mmHg (ref 30.0–45.0)
pO2, Ven: 42 mmHg (ref 30.0–45.0)

## 2011-01-22 LAB — COMPREHENSIVE METABOLIC PANEL
ALT: 17 U/L (ref 0–35)
AST: 24 U/L (ref 0–37)
AST: 44 U/L — ABNORMAL HIGH (ref 0–37)
Albumin: 3.6 g/dL (ref 3.5–5.2)
Alkaline Phosphatase: 63 U/L (ref 39–117)
CO2: 28 mEq/L (ref 19–32)
Calcium: 8.9 mg/dL (ref 8.4–10.5)
Chloride: 105 mEq/L (ref 96–112)
Creatinine, Ser: 1.01 mg/dL (ref 0.4–1.2)
GFR calc Af Amer: >60 mL/min (ref >60)
GFR calc Af Amer: >60 mL/min (ref >60)
GFR calc non Af Amer: 57 mL/min — ABNORMAL LOW (ref >60)
GFR calc non Af Amer: >60 mL/min (ref >60)
Potassium: 3.9 mEq/L (ref 3.5–5.1)
Sodium: 137 mEq/L (ref 135–145)
Total Bilirubin: 1.4 mg/dL — ABNORMAL HIGH (ref 0.3–1.2)
Total Protein: 7.6 g/dL (ref 6.0–8.3)

## 2011-01-22 LAB — PLATELET FUNCTION ASSAY: Collagen / Epinephrine: 178 seconds (ref 0–184)

## 2011-01-22 LAB — PLATELET COUNT: Platelets: 118 K/uL — ABNORMAL LOW (ref 150–400)

## 2011-01-22 LAB — COMPREHENSIVE METABOLIC PANEL WITH GFR
Albumin: 3.3 g/dL — ABNORMAL LOW (ref 3.5–5.2)
Alkaline Phosphatase: 77 U/L (ref 39–117)
BUN: 9 mg/dL (ref 6–23)
CO2: 24 meq/L (ref 19–32)
Chloride: 100 meq/L (ref 96–112)
Glucose, Bld: 525 mg/dL (ref 70–99)
Potassium: 2.9 meq/L — ABNORMAL LOW (ref 3.5–5.1)
Total Bilirubin: 0.8 mg/dL (ref 0.3–1.2)

## 2011-01-22 LAB — TYPE AND SCREEN
ABO/RH(D): A POS
Antibody Screen: NEGATIVE

## 2011-01-22 LAB — PROTIME-INR
INR: 1 (ref 0.00–1.49)
INR: 1.5 (ref 0.00–1.49)
Prothrombin Time: 12.9 seconds (ref 11.6–15.2)
Prothrombin Time: 17.8 seconds — ABNORMAL HIGH (ref 11.6–15.2)

## 2011-01-22 LAB — POCT I-STAT, CHEM 8
BUN: 10 mg/dL (ref 6–23)
Calcium, Ion: 1.17 mmol/L (ref 1.12–1.32)
Chloride: 107 meq/L (ref 96–112)
Creatinine, Ser: 0.7 mg/dL (ref 0.4–1.2)
Glucose, Bld: 164 mg/dL — ABNORMAL HIGH (ref 70–99)
HCT: 30 % — ABNORMAL LOW (ref 36.0–46.0)
Hemoglobin: 10.2 g/dL — ABNORMAL LOW (ref 12.0–15.0)
Potassium: 4 mEq/L (ref 3.5–5.1)
Sodium: 139 mEq/L (ref 135–145)
TCO2: 22 mmol/L (ref 0–100)

## 2011-01-22 LAB — CK TOTAL AND CKMB (NOT AT ARMC)
CK, MB: 14.8 ng/mL — ABNORMAL HIGH (ref 0.3–4.0)
CK, MB: 39.6 ng/mL — ABNORMAL HIGH (ref 0.3–4.0)
Relative Index: 12 — ABNORMAL HIGH (ref 0.0–2.5)
Relative Index: 4.7 — ABNORMAL HIGH (ref 0.0–2.5)
Total CK: 317 U/L — ABNORMAL HIGH (ref 7–177)
Total CK: 330 U/L — ABNORMAL HIGH (ref 7–177)

## 2011-01-22 LAB — URINE MICROSCOPIC-ADD ON

## 2011-01-22 LAB — CREATININE, SERUM
Creatinine, Ser: 0.6 mg/dL (ref 0.4–1.2)
GFR calc Af Amer: >60 mL/min (ref >60)
GFR calc non Af Amer: >60 mL/min (ref >60)

## 2011-01-22 LAB — DIFFERENTIAL
Basophils Absolute: 0 10*3/uL (ref 0.0–0.1)
Basophils Absolute: 0.1 K/uL (ref 0.0–0.1)
Basophils Relative: 0 % (ref 0–1)
Basophils Relative: 1 % (ref 0–1)
Eosinophils Absolute: 0 10*3/uL (ref 0.0–0.7)
Eosinophils Absolute: 0.2 10*3/uL (ref 0.0–0.7)
Eosinophils Relative: 0 % (ref 0–5)
Eosinophils Relative: 2 % (ref 0–5)
Lymphocytes Relative: 50 % — ABNORMAL HIGH (ref 12–46)
Lymphocytes Relative: 9 % — ABNORMAL LOW (ref 12–46)
Lymphs Abs: 0.9 10*3/uL (ref 0.7–4.0)
Lymphs Abs: 3.7 10*3/uL (ref 0.7–4.0)
Monocytes Absolute: 0.5 K/uL (ref 0.1–1.0)
Monocytes Absolute: 0.7 10*3/uL (ref 0.1–1.0)
Monocytes Relative: 6 % (ref 3–12)
Monocytes Relative: 7 % (ref 3–12)
Neutro Abs: 3.1 K/uL (ref 1.7–7.7)
Neutro Abs: 8.5 10*3/uL — ABNORMAL HIGH (ref 1.7–7.7)
Neutrophils Relative %: 41 % — ABNORMAL LOW (ref 43–77)
Neutrophils Relative %: 84 % — ABNORMAL HIGH (ref 43–77)

## 2011-01-22 LAB — URINALYSIS, ROUTINE W REFLEX MICROSCOPIC
Bilirubin Urine: NEGATIVE
Glucose, UA: >1000 mg/dL — AB
Ketones, ur: NEGATIVE mg/dL
Leukocytes, UA: NEGATIVE
Nitrite: NEGATIVE
Protein, ur: 30 mg/dL — AB
Specific Gravity, Urine: 1.024 (ref 1.005–1.030)
Urobilinogen, UA: 1 mg/dL (ref 0.0–1.0)
pH: 6 (ref 5.0–8.0)

## 2011-01-22 LAB — URINE CULTURE
Colony Count: NO GROWTH
Culture: NO GROWTH

## 2011-01-22 LAB — APTT
aPTT: 22 s — ABNORMAL LOW (ref 24–37)
aPTT: 29 seconds (ref 24–37)

## 2011-01-22 LAB — POCT I-STAT GLUCOSE
Glucose, Bld: 191 mg/dL — ABNORMAL HIGH (ref 70–99)
Operator id: 238831

## 2011-01-22 LAB — C-PEPTIDE: C-Peptide: 0.54 ng/mL — ABNORMAL LOW (ref 0.80–3.90)

## 2011-01-22 LAB — HEMOGLOBIN A1C
Hgb A1c MFr Bld: 10.3 % — ABNORMAL HIGH (ref 4.6–6.1)
Hgb A1c MFr Bld: 10.3 % — ABNORMAL HIGH (ref 4.6–6.1)
Mean Plasma Glucose: 249 mg/dL
Mean Plasma Glucose: 249 mg/dL

## 2011-01-22 LAB — BASIC METABOLIC PANEL WITH GFR
BUN: 15 mg/dL (ref 6–23)
CO2: 23 meq/L (ref 19–32)
Calcium: 8.9 mg/dL (ref 8.4–10.5)
Glucose, Bld: 452 mg/dL — ABNORMAL HIGH (ref 70–99)
Sodium: 138 meq/L (ref 135–145)

## 2011-01-22 LAB — ABO/RH: ABO/RH(D): A POS

## 2011-01-22 LAB — HEPARIN LEVEL (UNFRACTIONATED)
Heparin Unfractionated: 0.54 IU/mL (ref 0.30–0.70)
Heparin Unfractionated: 0.57 IU/mL (ref 0.30–0.70)

## 2011-01-22 LAB — POCT CARDIAC MARKERS
CKMB, poc: 1.5 ng/mL (ref 1.0–8.0)
Myoglobin, poc: 141 ng/mL (ref 12–200)
Troponin i, poc: <0.05 ng/mL (ref 0.00–0.09)

## 2011-01-22 LAB — CALCIUM, IONIZED: Calcium, Ion: 1.19 mmol/L (ref 1.12–1.32)

## 2011-01-22 LAB — MAGNESIUM
Magnesium: 1.9 mg/dL (ref 1.5–2.5)
Magnesium: 2.2 mg/dL (ref 1.5–2.5)
Magnesium: 2.3 mg/dL (ref 1.5–2.5)
Magnesium: 2.4 mg/dL (ref 1.5–2.5)

## 2011-01-22 LAB — TSH: TSH: 1.123 u[IU]/mL (ref 0.350–4.500)

## 2011-01-22 LAB — CARDIAC PANEL(CRET KIN+CKTOT+MB+TROPI)
CK, MB: 26.5 ng/mL — ABNORMAL HIGH (ref 0.3–4.0)
Relative Index: 7.6 — ABNORMAL HIGH (ref 0.0–2.5)

## 2011-01-22 LAB — HEMOGLOBIN AND HEMATOCRIT, BLOOD
HCT: 22.4 % — ABNORMAL LOW (ref 36.0–46.0)
Hemoglobin: 7.5 g/dL — CL (ref 12.0–15.0)

## 2011-01-22 LAB — LIPID PANEL
LDL Cholesterol: 122 mg/dL — ABNORMAL HIGH (ref 0–99)
VLDL: 18 mg/dL (ref 0–40)

## 2011-01-22 LAB — MRSA PCR SCREENING
MRSA by PCR: NEGATIVE
MRSA by PCR: NEGATIVE

## 2011-01-22 LAB — BRAIN NATRIURETIC PEPTIDE: Pro B Natriuretic peptide (BNP): 285 pg/mL — ABNORMAL HIGH (ref 0.0–100.0)

## 2011-01-22 LAB — PREPARE RBC (CROSSMATCH)

## 2011-01-22 LAB — TROPONIN I: Troponin I: 7.13 ng/mL (ref 0.00–0.06)

## 2011-03-01 NOTE — Discharge Summary (Signed)
[name], [name] NO.:  [id number]   MEDICAL RECORD NO.:  [id number]          PATIENT TYPE:  INP   LOCATION:  2006                         FACILITY:  [facility]   PHYSICIAN:  Sheliah Plane, MD    DATE OF BIRTH:  [birth year]   DATE OF ADMISSION:  06/07/2009  DATE OF DISCHARGE:  06/15/2009                               DISCHARGE SUMMARY   ADDENDUM   DISCHARGE MEDICATIONS:  1. Enteric-coated aspirin 325 mg p.o. daily.  2. Coreg 25 mg p.o. 2 times daily.  3. Lasix 40 mg p.o. daily x7 days.  4. Insulin glargine 20 units subcutaneous at night.  5. Nu-Iron 150 mg p.o. daily.  6. Lisinopril 2.5 mg p.o. daily.  7. Metformin 1000 mg p.o. 2 times daily.  8. Oxycodone IR 5 mg 1-2 tablets every 4-6 hours as needed for pain.  9. Potassium chloride 40 mEq p.o. daily x7 days.  10.Simvastatin 40 mg p.o. at bedtime.  11.Folic acid 1 mg p.o. daily.   It should be noted that a followup appointment with [facility]  [facility] has been scheduled for Thursday, June 18, 2009.  Again,  our office will contact the patient with an appointment for 3 weeks and  prior to this office appointment, a chest x-ray will be obtained.  The  patient was also instructed to call and make a followup appointment to  see Dr. Eden Emms in 2 weeks.      Doree Fudge, [place]      Sheliah Plane, MD  Electronically Signed    DZ/MEDQ  D:  06/15/2009  T:  06/16/2009  Job:  [id number]   cc:   Noralyn Pick. Eden Emms, MD, [facility]

## 2011-03-01 NOTE — Assessment & Plan Note (Signed)
OFFICE VISIT   [name], [name]  DOB:  22-Nov-1955                                        July 09, 2009  CHART #:  [id number]   The patient returns to the office today in followup after her recent  urgent coronary artery bypass graft, presentation was new diagnosis of  diabetes/pulmonary edema, positive cardiac enzymes, and admission  glucose of over 500.  She is found to have a depressed LV function in  the 20-25% range.  She underwent coronary artery bypass grafting x3 on  June 11, 2009.  Since discharge, the patient has been doing reasonably  well.  She is increasing her physical activity and looks markedly better  than she did while in the hospital.  She does have issues with followup  medical care.  She notes that she has been to the [facility]  Department for assistance with her medications, especially because of  the new diagnosis of diabetes.  She has not been back to see the [facility]  Cardiology yet.  She is enrolled in cardiac rehab in [facility].   On exam, her blood pressure is 103/60, pulse 98, respiratory rate is 16,  and O2 sat is 98%.  Her sternum is stable and well healed.  Her lungs  are clear bilaterally, endovein harvest sites also healed well without  evidence of infection.  She has no pedal edema.   Followup chest x-ray shows very slight blunting of left costophrenic  angle, otherwise clear lung fields.   She continues on aspirin 325 mg a day, Coreg 25 mg b.i.d., Lantus  insulin 20 units once a day, Nu-Iron, lisinopril 2.5 mg a day, metformin  1000 mg b.i.d., simvastatin 40 mg a day, and folic acid.   Overall, she seems to be doing well.  Though I am talking to her, this  will be important to maintain close medical followup on her new  diagnosis of diabetes and we have made a return appointment to see the  [facility].  I have stressed the importance of close followup  with her and obtaining a primary care  doctor to assist her with her  diabetes.  I have not made a return appointment for her to see me, but  we would be glad to see her at her request.   Sheliah Plane, MD  Electronically Signed   EG/MEDQ  D:  07/09/2009  T:  07/10/2009  Job:  [id number]   cc:   Noralyn Pick. Eden Emms, MD, [facility]

## 2011-03-01 NOTE — Op Note (Signed)
NAMENOMI, RUDNICKI NO.:  [id number]   MEDICAL RECORD NO.:  [id number]          PATIENT TYPE:  INP   LOCATION:  2015                         FACILITY:  [facility]   PHYSICIAN:  Sheliah Plane, MD    DATE OF BIRTH:  08-26-1956   DATE OF PROCEDURE:  06/11/2009  DATE OF DISCHARGE:                               OPERATIVE REPORT   PREOPERATIVE DIAGNOSES:  Severe 3-vessel coronary artery disease with  recent acute myocardial infarction with pulmonary edema and respiratory  failure.   POSTOPERATIVE DIAGNOSES:  Severe 3-vessel coronary artery disease with  recent acute myocardial infarction with pulmonary edema and respiratory  failure.   SURGICAL PROCEDURE:  Coronary artery bypass grafting x4 with left  internal mammary to the left anterior descending coronary artery,  reverse saphenous vein graft to the second diagonal coronary artery,  reverse saphenous vein graft to the posterior descending coronary artery  with right leg endovein harvesting.   SURGEON:  Sheliah Plane, MD   ASSISTANTS:  1. Evelene Croon, MD  2. Doree Fudge, PA   BRIEF HISTORY:  The patient is a 55 year old female who 2 days before  was admitted through the emergency room in acute respiratory distress,  pulmonary edema, positive cardiac enzymes with presumed flash pulmonary  edema based on ischemia.  Cardiac catheterization was performed that  showed severe 3-vessel coronary artery disease.  The patient also had a  new diagnosis of diabetes with admission glucose of over 500.  She was  stabilized medically with clearing of her lung fields and chest x-ray.  Because of the severe 3-vessel coronary artery disease, coronary artery  bypass grafting was recommended.  The patient's ejection fraction was  reduced in the 20-25% range.  Risks and options were discussed with the  patient and her family in detail.  The patient was willing to proceed.   DESCRIPTION OF PROCEDURE:  With Swan-Ganz and  arterial line monitors in  place, the patient underwent general endotracheal anesthesia without  incidence.  Skin of the chest and legs prepped with Betadine and draped  in the usual sterile manner.  Using the [employer] endovein harvest system,  vein was harvested from the right thigh and upper calf.  Median  sternotomy was performed.  Left internal mammary artery was dissected  down as a pedicle graft.  Distal artery was divided, had good free flow.  Pericardium was opened.  The patient appeared to have depressed LV  function with significant left ventricular hypertrophy.  She was  systemically heparinized.  Ascending aorta was cannulated.  The right  atrium was cannulated.  An aortic root vent and cardioplegia needle was  introduced into the ascending aorta.  The patient was placed on  cardiopulmonary bypass at 2.4 liters per minute per meter squared.  Sites of anastomosis were selected and dissected at the epicardium.  The  patient's body temperature was cooled to 30 degrees.  Aortic cross-clamp  was applied and 500 mL of cold blood potassium cardioplegia was  administered with diastolic arrest of the heart.  Myocardial septal  temperature was monitored throughout the cross-clamp period.  Attention  was  turned first to the lateral wall of the heart where a large first  obtuse marginal was identified and was opened and was admitted a 1.5-mm  probe.  Using a running 7-0 Prolene, distal anastomosis was performed.  Attention was then turned to the second diagonal coronary artery, which  was smaller but admitted a 1-mm probe distally.  Using a running 7-0  Prolene, distal anastomosis was performed with second reverse saphenous  vein graft.  Attention was then turned to the posterior descending  coronary artery.  The posterolateral was also diseased, but the  posterolateral branches arose from inferior surface of the heart, was  extremely small.  Posterior descending was small but suitable  for  bypass.  The vessel was opened in the midportion of the PD, admitted a 1-  mm probe distally.  Using a running 7-0 Prolene, distal anastomosis was  performed.  Attention was then turned to the left anterior descending  coronary artery where in the midportion of the LAD the vessel was  opened, was of good size vessel, admitted a 1.5-mm probe distally.  Using a running 8-0 Prolene, left internal mammary artery was  anastomosed to left anterior descending coronary artery.  With release  of the bulldog on the mammary artery, there was rise in myocardial  septal temperature.  With the bulldog was placed back on the mammary  artery with the cross-clamp still in place, 3 punch aortotomies were  performed.  Each 3 vein grafts were anastomosed to the ascending aorta.  Air was evacuated from grafts and partial occlusion clamp was removed.  Sites of anastomosis were inspected, were free of bleeding.  The patient  was then ventilated and weaned from cardiopulmonary bypass, on milrinone  and dopamine infusion.  She remained hemodynamically stable, was  decannulated in the usual fashion.  Protamine sulfate was administered  with the operative field hemostatic.  Two atrial and two ventricular  wires were applied.  Graft markers were applied.  Left pleural, Blake  mediastinal drain were left in place.  Pericardium was reapproximated.  Sternum was closed with #6 stainless steel wire.  Fascia closed with  interrupted 0 Vicryl running, 3-0 Vicryl subcutaneous tissue, and a 4-0  subcuticular stitch in the skin edges.  Dry dressings were applied.  Sponge and needle count was reported as correct at the completion of  procedure.  The patient tolerated the procedure without obvious  complication and was transferred to Surgical Intensive Care Unit for  further postoperative care.      Sheliah Plane, MD  Electronically Signed     EG/MEDQ  D:  06/14/2009  T:  06/15/2009  Job:  [id number]   cc:   Noralyn Pick. Eden Emms, MD, [facility]

## 2011-03-01 NOTE — Consult Note (Signed)
NAMESYLVANIA, MOSS NO.:  [id number]   MEDICAL RECORD NO.:  [id number]          PATIENT TYPE:  INP   LOCATION:  2111                         FACILITY:  [facility]   PHYSICIAN:  Noralyn Pick. Eden Emms, MD, FACCDATE OF BIRTH:  Mar 17, 1956   DATE OF CONSULTATION:  DATE OF DISCHARGE:                                 CONSULTATION   A 55 year old patient admitted by the hospital service this morning for  pulmonary edema.   We are asked to see her in regards to positive enzymes.  Unfortunately,  the patient was sedated and on BiPAP, she was unable to give me a  history.  I reviewed the emergency room notes.  The patient has no  previously documented history of coronary artery disease.  She is a  hypertensive, diabetic, and had increasing dyspnea.  Apparently, it was  sudden onset.  She had difficulty speaking and EMS was called.  EMS  found her to be in pulmonary edema and hypertensive.  She denied any  chest pain.  Her symptoms have improved with CPAP, diuretics, and nitro.   The patient has not had any previous palpitations or syncope.  She has  not had previous diagnosis of valvular heart disease, coronary artery  disease, or cardiomyopathy.   CORONARY RISK FACTORS:  Apparently include hypertension and diabetes.   MEDICATIONS:  Accurate drug list is not available.   ALLERGIES:  She has no known drug allergies.   I was unable to get a social history or family history from the patient.   I called the echo tech in to do a bedside echocardiogram. I reviewed her  echocardiogram, which shows severe left ventricular cavity enlargement  with moderate LVH.  There is global hypokinesis with an EF of 20%-25%.  There is mild MR.  She has a small pericardial effusion with no evidence  of tamponade.   Review of the patient's EKG shows no ST elevations.  She would not  appear to be a candidate for being taken acutely to the lab,  particularly since this would likely necessitate  intubation.  She has no  ST elevation and only signs of poor R-wave progression and LVH.  Reviewed all of her lab work, which showed troponin of 7.13 and a CPK of  330, MB 39.6, relative index of 12.   The patient's current medications in the medical intensive care unit did  not include regular dose aspirin.  She is on Lasix 100 mg or 60 mg IV  q.8 h.  She was on subcu heparin 5000 q.8 h.  Protonix, potassium  replacement, acetaminophen.  She is being written for an insulin drip.  She is on nitroglycerin at 20 mcg a minute and p.r.n. oxycodone.   Again, there are no known allergies.   PHYSICAL EXAMINATION:  GENERAL:  Remarkable for sedated black female  wearing an oxygen face mask.  She does not appear in any distress.  VITAL SIGNS:  She has sinus tachycardia at a rate of 110-115, blood  pressure is 128/70.  She is afebrile.  HEENT:  Unremarkable.  NECK:  Supple.  There are no  carotid bruits.  JVP is elevated.  No  lymphadenopathy or thyromegaly.  LUNGS:  Diffuse inspiratory rales bilaterally.  There is an S1 and S2  with no obvious murmur.  PMI is increased.  ABDOMEN:  Benign bowel sounds positive.  No AAA.  No tenderness.  No  bruit.  No hepatosplenomegaly or hepatojugular reflux, or tenderness.  EXTREMITIES:  Distal pulses are intact.  No edema.  NEURO:  Nonfocal, although she is sedated.  SKIN:  Warm and dry.  No muscular weakness.   DIAGNOSTIC DATA:  X-ray shows cardiac enlargement with pulmonary edema.  Other lab work besides what was indicated in the HPI show TSH of 1.1,  potassium 3.5, blood sugar 229, BUN 15, creatinine 0.9.  BNP 285.   EKGs as discussed in HPI.  No acute ST elevation, LVH with poor R-wave  progression.   A 2-D echocardiogram reviewed as indicated in the HPI.   IMPRESSION:  1. Subendocardial myocardial infarction.  The patient's CPK appears to      be significant, however there is no ST elevation on her EKG and in      fact, her LV dysfunction is  global and diffuse.  She needs to be      stabilized on nitro and Lasix.  We will try to clear her pulmonary      edema.  I have written her for 4 baby aspirin everyday.  We will      stop her subcu heparin and put her on systemic IV heparin.  I would      like to try to add a low dose of a beta-blocker such as Coreg 3.125      b.i.d. to get her heart rate under somewhat better control,      although there is a small risk in this in regards to clearing her      pulmonary edema.  We will refer her for right and left heart cath      later in the week, unless her hemodynamics become unstable or she      develops acute ST elevation.  2. Pulmonary edema, congestive heart failure.  I suspect she has a      chronic cardiomyopathy.  On top of this, she may have developed      subendocardial myocardial infarction given her diabetes, the fact      that she lacked any significant chest pain, is likely means that      her dyspnea is her anginal equivalent as we often see with      diabetes.  The patient will at some point need to be placed on an      ACE inhibitor.  For the time being, we will keep her on IV nitro      and diurese her.  She will need a right heart catheterization to      assess her feeling pressures at the time of her catheterization.  3. Hypertension.  Currently well controlled.  4. Diabetes per primary service.  Insulin being administered.  Tight      control of blood sugar in the setting of an myocardial infarction      is very important in regards to her morbidity and mortality.   We will follow the patient closely along with her primary service.  Timing of her heart cath will likely be in 24-48 hours.   Addendum:  Subsequenlty met with huspand and multiple family members.  They confirm that she has not seen a  Dr. in years.  She was not taking  any medication routinely.  She does not work and watches t.v. most days.  She had been feeling well up until the time of acute  SOB.      Noralyn Pick. Eden Emms, MD, [facility]  Electronically Signed     PCN/MEDQ  D:  06/07/2009  T:  06/08/2009  Job:  [id number]

## 2011-03-01 NOTE — Cardiovascular Report (Signed)
[name], [name] NO.:  [id number]   MEDICAL RECORD NO.:  [id number]          PATIENT TYPE:  INP   LOCATION:  2111                         FACILITY:  [facility]   PHYSICIAN:  Pamela R. Juanda Chance, MD, FACCDATE OF BIRTH:  [birth year]   DATE OF PROCEDURE:  06/09/2009  DATE OF DISCHARGE:                            CARDIAC CATHETERIZATION   CLINICAL HISTORY:  Ms. Diss is a 55 year old who was admitted with  acute pulmonary edema, which has responded to treatment.  She had  positive enzymes consistent with a non-ST-elevation MI.  An  echocardiogram showed an ejection fraction of 30%.  She is scheduled for  evaluation of angiography.  She is newly diagnosed diabetic, but has  been on no medications prior to admission.   PROCEDURE:  Right heart catheterization was performed percutaneously via  right femoral vein using a venous sheath and Swan-Ganz thermodilution  catheter.  Left heart catheterization was performed percutaneously via  right femoral artery using arterial sheath and 5-French preformed  coronary catheters.  A front wall arterial puncture was performed and  Omnipaque contrast was used.  The patient tolerated the procedure well  and left the laboratory in satisfactory condition.   RESULTS:  Left Main Coronary Artery:  The left main coronary artery was  free of significant disease.   Left Anterior Descending Artery:  The left anterior descending artery  gave rise to 2 diagonal branches and several septal perforators.  There  was an 80% ostial stenosis.  There was a long 90% stenosis in the  midportion of the vessel.  Second diagonal branch was completely  occluded and filled via collaterals from the distal LAD.  This was a  fairly large vessel.   Circumflex Artery:  The circumflex artery had an 80% ostial stenosis and  then gave rise to an atrial branch and was completely occluded  proximally.  The distal vessel filled via bridging collaterals from the  circumflex artery and also via collaterals from the right coronary  artery and consisted of a fairly large posterolateral branch.   Right Coronary Artery:  The right coronary was a moderately large vessel  and gave rise to a conus branch, 2 right ventricle branches, a posterior  descending branch and posterolateral branch.  There was 30% narrowing in  the midvessel.  There was 90% stenosis in the proximal mid posterior  descending artery and there was a 90% stenosis in the proximal portion  of the posterolateral branch.   Left Ventriculogram:  The left ventriculogram performed in the RAO  projection showed global hypokinesis with estimated fraction of 30%.   Distal Aortogram:  A distal aortogram was performed, which showed patent  renal arteries and no significant aortoiliac obstruction.   HEMODYNAMIC DATA:  The right pressure was 2 mean.  The pulmonary artery  pressure was 31/10 with mean of 20.  Pulmonary wedge pressure was 8.  Left ventricular pressure was 112/7.  The aortic pressure was 112/62  with mean of 82.  Cardiac output/cardiac index was 9.1/4.9  liters/minute/meter squared by Fick.  The pulmonary artery saturation  was 75% and the arterial saturation was  92%.   CONCLUSION:  Severe 3-vessel coronary artery disease with 80% ostial and  90% mid stenosis in the LAD with total occlusion of second diagonal  branch, 80% ostial and total proximal occlusion of the circumflex  artery, 90% stenosis in the posterolateral and posterior descending  branches of the right coronary and global hypokinesis and estimated  fraction of 30%.   RECOMMENDATIONS:  The patient has severe three-vessel disease and I  think needs surgical revascularization.  She does have some wall motion  in all areas and she may get improvement in her LV function.  We will  plan surgical consultation.   ADDENDUM:  Distal Aortogram:  A distal aortogram was performed, which  showed patent renal arteries and no  significant aortoiliac obstruction.      Pamela Elvera Lennox Juanda Chance, MD, [facility]  Electronically Signed     BRB/MEDQ  D:  06/09/2009  T:  06/10/2009  Job:  [id number]   cc:   [facility] B Team  Noralyn Pick. Eden Emms, MD, [facility]

## 2011-03-01 NOTE — Discharge Summary (Signed)
[name], [name] NO.:  [id number]   MEDICAL RECORD NO.:  [id number]          PATIENT TYPE:  INP   LOCATION:  2015                         FACILITY:  [facility]   PHYSICIAN:  Sheliah Plane, MD    DATE OF BIRTH:  [birth year]   DATE OF ADMISSION:  06/07/2009  DATE OF DISCHARGE:                               DISCHARGE SUMMARY   FINAL DIAGNOSES:  Three-vessel coronary artery disease with positive  enzymes and acute myocardial infarction.   IN-HOSPITAL DIAGNOSES:  1. Flash pulmonary edema and respiratory distress.  2. Type 2 diabetes mellitus with a hemoglobin A1c of 10.3.  3. Volume overload postoperatively.  4. Acute blood loss anemia postoperatively.  5. Thrombocytopenia postoperatively.   IN-HOSPITAL OPERATIONS AND PROCEDURES:  1. Cardiac catheterization.  2. Coronary artery bypass grafting x4 using a left internal mammary      artery to left anterior descending, saphenous vein graft to      diagonal, saphenous vein graft to OM, saphenous vein graft to      posterior descending artery.  3. Endovein harvesting of right leg.   HISTORY AND PHYSICAL AND HOSPITAL COURSE:  The patient is a 55 year old  African American female with no previous documented medical history who  presented to the emergency room because of sudden onset respiratory  distress found to be in pulmonary edema with elevated troponin peaking  at 7.  She was admitted, started on BiPAP and diuresed.  The patient was  ultimately seen by Dr. Eden Emms where she underwent cardiac  catheterization demonstrating significant coronary artery disease.  The  patient has no previous history of cardiac disease, also no past medical  history.  Since admission, she has been diagnosed with diabetes mellitus  with a hemoglobin A1c of 10.3.  She denies any previous surgery.  The  patient was seen and evaluated by Dr. Tyrone Sage.  Dr. Tyrone Sage discussed  with the patient undergoing coronary artery bypass grafting.   He  discussed the risks and benefits with the patient.  The patient  acknowledged her understanding and agreed to proceed.  Surgery was  scheduled for June 10, 2009.  Preoperatively, the patient underwent  bilateral carotid duplex ultrasound showing no significant ICA stenosis.  She also had preoperative ABIs done showing to be bilaterally greater  than 1.0.  The patient remained stable preoperatively.  For further  details of the patient's past medical history and physical exam, please  see dictated H&P.   The patient was taken to the operating room on June 10, 2009, where  she underwent coronary artery bypass grafting x4 using a left internal  mammary artery to the left anterior descending, saphenous vein graft to  diagonal, saphenous vein graft to obtuse marginal, saphenous vein graft  to posterior descending artery.  Endoscopic vein harvesting of the right  leg was done.  The patient tolerated this procedure well and was  transferred to the intensive care unit in a stable condition.  Postoperatively, the patient was noted to be hemodynamically stable.  She was extubated on the evening of surgery.  Post extubation, the  patient was noted to be  alert and oriented x4.  Neuro intact.  Postoperatively, the patient was noted to be in normal sinus rhythm.  Blood pressure stable.  All drips were able to be weaned and  discontinued.  She was started on beta-blocker as well as an ACE  inhibitor.  Blood pressure was followed closely and it stabilized.  The  patient's heart rate remained in sinus rhythm to sinus tachycardia in  the low 100s.  This was followed closely.  Postoperatively, the patient  was placed on nasal cannula post extubation.  Chest x-ray obtained was  stable.  The patient had minimal drainage from chest tubes and chest  tubes were discontinued in normal fashion.  The patient was encouraged  to use her incentive spirometer and was able to be weaned off oxygen  sating  greater than 90% on room air.  The patient is diagnosed with  diabetes during this admission.  Blood sugars were followed closely  postoperatively.  She was started on Lantus insulin.  Once tolerating  diet, the patient was started on p.o. metformin.  Her blood sugars  remained elevated and she required continuation of metformin and Lantus  insulin.  Currently, blood sugars are starting to stabilize.  The  patient did have acute blood loss anemia postoperatively.  Her  hemoglobin/hematocrit was followed closely.  No blood transfusions were  required postoperatively.  The patient's H&H continued to trend down.  She was started on p.o. iron on postop day #4 for hemoglobin/hematocrit  of 8.4 and 24.2.  This will be followed prior to discharge home.  The  patient did have some mild volume overload postoperatively and was  started on diuretics.  Daily weights were obtained and this was  improving slowly.  Postoperatively, the patient was up ambulating  without difficulty.  She was tolerating diet well.  No nausea, vomiting  noted.  All incisions were clean, dry, and intact and healing well.   Postop day #4, June 14, 2009, the patient was noted to be afebrile.  She is in normal sinus rhythm.  Blood pressure stable.  She is sating  greater than 90% on room air.  Most recent lab work shows white blood  cell count of 86.4, hemoglobin of 8.1, hematocrit 24.2, platelet count  130.  Sodium of 140, potassium 3.8, chloride of 108, bicarbonate 28, BUN  9, creatinine 0.78, glucose 142.  The patient is ready tentatively for  discharge home in the a.m. pending she remained stable.   FOLLOWUP APPOINTMENTS:  A followup appointment will be arranged with Dr.  Tyrone Sage for her in 3 weeks.  Our office will contact the patient with  this information.  The patient will need to obtain PA and lateral chest  x-ray 30 minutes prior to this appointment.  The patient will need to  follow up with Dr. Eden Emms in 2  weeks.  She will need to contact his  office to schedule this appointment.  An appointment has been arranged  for the patient to see [facility] Department, June 23, 2009, at 9:30 a.m.  If they are unable to manage the patient's diabetes,  she will need a primary care physician for further management.   ACTIVITY:  The patient instructed no driving to release to do so, no  lifting over 10 pounds.  She is told to ambulate 3-4 times per day,  progress as tolerated, and to continue her breathing exercises.   INCISIONAL CARE:  The patient is told to shower washing her incisions  using soap and water.  She is to contact the office if she develops any  drainage or opening from any of her incision sites.   DIET:  The patient educated on diet to be low-fat, low-salt as well as  diabetic diet.   DISCHARGE MEDICATIONS:  1. Aspirin 325 mg daily.  2. Labetalol 12.5 mg b.i.d.  3. Lasix 20 mg daily x7 days.  4. Insulin glargine 20 units subcu at night.  5. Iron complex 150 mg daily.  6. Lisinopril 2.5 mg daily.  7. Metformin 1000 mg b.i.d.  8. Oxycodone 5 mg 1-2 tablets q.4-6 hours p.r.n. pain.  9. Potassium chloride 20 mEq daily x7 days.  10.Simvastatin 40 mg daily.      Sol Blazing, PA      Sheliah Plane, MD  Electronically Signed    KMD/MEDQ  D:  06/14/2009  T:  06/14/2009  Job:  [id number]   cc:   Noralyn Pick. Eden Emms, MD, [facility]

## 2011-03-01 NOTE — Consult Note (Signed)
NAMEAHRIYAH, [name] NO.:  [id number]   MEDICAL RECORD NO.:  [id number]          PATIENT TYPE:  INP   LOCATION:  2111                         FACILITY:  [facility]   PHYSICIAN:  Sheliah Plane, MD    DATE OF BIRTH:  May 14, 1956   DATE OF CONSULTATION:  DATE OF DISCHARGE:                                 CONSULTATION   REFERRED BY:  Noralyn Pick. Eden Emms, MD, [facility]   CATHETERIZED BY:  Everardo Beals Juanda Chance, MD, [facility]   PRIMARY CARE PHYSICIAN:  Unknown.   ADMITTED BY:  Beckey Rutter, MD   REASON FOR CONSULTATION:  Three-vessel coronary artery disease, recent  flash pulmonary edema and respiratory distress with positive enzymes and  myocardial infarction, acute.   HISTORY OF PRESENT ILLNESS:  The patient is a 55 year old African  American lady with no previous documented medical history who presented  to the emergency room because of sudden onset of respiratory distress  found to be in pulmonary edema with elevated troponin peaking at 7.  She  was started on BiPAP, diuresed, ultimately was seen by Dr. Eden Emms and  cardiac catheterization was performed demonstrating significant coronary  disease.  She has had no previous history of cardiac disease, but also  seeks no medical attention.  Since admission, she has been diagnosed  with diabetes, which was also unknown and never treated.  She has had no  previous surgery.  She has two children 35 and 32.   SOCIAL HISTORY:  Lives with her husband who is diabetic.  Denies drug  use.  Nondrinker.  Quit smoking 5 years ago.  Prior to that, smoked one  pack a day for 20 years.   FAMILY HISTORY:  The patient's father died of natural causes at age  53.  Mother died at age 6 of natural causes.  She has two sisters and  three brothers.  One sister notes that she had diabetes, other medical  history is poorly defined.   REVIEW OF SYSTEMS:  The patient denies shortness of breath until the  sudden onset of this episode.  Denies chest  pain.  Denies syncope,  presyncope, denies orthopnea.  Denies pedal edema.  Other review of  systems, she denies constitutional symptoms until the acute onset of  recurrent symptoms.  She denies hemoptysis.  She has had no colonoscopy.  No flu shots.  No pneumococcal vaccination.  Denies blood in her stool  or urine.  All other review of systems is negative.   MEDICATIONS AND ALLERGIES:  She was taking no medications and notes no  known allergies.   PHYSICAL EXAMINATION:  The patient is returned from the cardiac cath  lab.  Her blood pressure is 150/87, sinus rhythm at 70.  She is in no  respiratory distress on 2 liters nasal cannula.  O2 sats 98%.  She is  awake and alert.  Neurologically intact.  I do not appreciate any  carotid bruits.  Lungs are clear.  I do not appreciate any murmur or  mitral insufficiency.  Abdominal exam is benign without palpable masses  or tenderness.  She has been removed  from the right groin with a  dressing in place.  She has IVs in both arms.  She has palpable dorsalis  pedis pulses bilaterally.  Appears to have adequate vein in her lower  extremities.   LABORATORY FINDINGS:  The patient's initial BNP was 285.  Initial  glucose was 525, creatinine of 1.0, hematocrit of 43, platelet count  233.  Chest x-ray serially over the past 3 days initially showed diffuse  alveolar infiltrations consistent with pulmonary edema, which is cleared  remarkably over the past 3 days.  Echocardiogram was performed with Dr.  Eden Emms, which showed depressed LV function 20-25%.  Cardiac  catheterization was done today with ejection fraction approximately 30%  with global hypokinesis.  Cardiac index is 4.9.  She has proximal 80%  circumflex and then distal to this, it is totaled with faint collateral  filling.  The right coronary artery in the PD and PL both have 80-90%  stenoses.  LAD has sequential 80-90% stenosis with long segmental area  in the mid LAD.   IMPRESSION:  The  patient who presents with subendocardial myocardial  infarction with elevated enzymes and flash pulmonary edema with three-  vessel coronary artery disease and onset of new diagnosis of probably  existing diabetes.  With the patient's severe three-vessel disease, I  agree with recommendation for coronary artery bypass grafting.  The  patient at this point appears to have stabilized from her respiratory  status.  She has not been loaded with Plavix.  I have recommended we  proceed with coronary artery bypass grafting.  The risks of surgery  including death, infection, stroke, myocardial infarction, bleeding,  blood transfusion, all have been discussed in detail with the patient  and her husband and son.  The risks of death, infection, stroke,  myocardial infarction, all have been reviewed.  We will obtain  preoperative Doppler studies and plan to proceed with coronary artery  bypass grafting on June 10, 2009.      Sheliah Plane, MD  Electronically Signed     EG/MEDQ  D:  06/09/2009  T:  06/10/2009  Job:  [id number]   cc:   Noralyn Pick. Eden Emms, MD, [facility]

## 2011-03-01 NOTE — H&P (Signed)
[name], [name] NO.:  [id number]   MEDICAL RECORD NO.:  [id number]          PATIENT TYPE:  EMS   LOCATION:  MAJO                         FACILITY:  [facility]   PHYSICIAN:  Beckey Rutter, MD  DATE OF BIRTH:  04/08/1956   DATE OF ADMISSION:  06/07/2009  DATE OF DISCHARGE:                              HISTORY & PHYSICAL   PRIMARY CARE PHYSICIAN:  Unassigned.   CHIEF COMPLAINT:  Shortness of breath.   HISTORY OF PRESENT ILLNESS:  This is a 55 year old African American lady  with no significant past medical history who presented today to the  emergency department by ambulance for sudden shortness of breath.  The  patient is speaking in short sentences and she is on BiPAP during the  history taking.  Her husband is at the bedside who stated that the  patient was not complaining of anything as of yesterday but this morning  she awakened him up and she was in severe respiratory distress.  He  called 911 and they brought her to the emergency department.  The  patient herself denied any other symptoms including chest pain at this  time.   PAST MEDICAL HISTORY:  No significant past medical history.   SOCIAL HISTORY:  Lives with her husband.  No drug abuse, nondrinker, and  no tobacco abuse.  The patient quit 5 years ago.  She used to smoke 1-  pack per day for the last 20 years prior to her quitting.   FAMILY HISTORY:  Family history significant for diabetes and  hypertension.   MEDICATION ALLERGIES:  Not known to have medication allergy.   MEDICATIONS:  None.   REVIEW OF SYSTEMS:  A 14-point review of system is  unremarkable/noncontributory.  The rest as per the HPI.   PHYSICAL EXAMINATION:  VITAL SIGNS:  The first blood pressure recorded  upon arrival to emergency room at 4:00 a.m. is 197/113 and her pulse was  145 at that time.  The last blood pressure documented is 113/84 with  pulse of 130.  Respiratory rate of 22.  HEAD:  Atraumatic and  normocephalic.  EYES:  PERRL.  MOUTH:  Moist.  No ulcer.  NECK:  Supple.  No JVD.  HEART:  Precordium first and second heart sound audible.  No added  sounds appreciated.  The patient is on BiPAP.  LUNGS:  The patient has soft, gravid crepitation accentuated more in the  lower lobes of her both lungs.  ABDOMEN:  Soft and nontender.  Bowel sounds present.  EXTREMITIES:  No lower extremity edema.   LABORATORY DATA AND X-RAYS:  Her chest x-ray was showing:  1. Diffuse fluffy opacification involving linearly the entirety of      both lungs, more prominent on the right.  This could reflect ARDS      with marked pulmonary edema or diffuse underlying infectious      process.  2. Likely small right-sided pleural effusion.  3. Mild cardiomegaly.   Her EKG was showing sinus tachycardia at ventricular rate of 130 with ST  depression anterolaterally and inferiorly.  Her sodium is 136, potassium  2.9, chloride 100, bicarb is 24, glucose is 525, creatinine is 1.0, and  BUN is 9.  GFR is more than 60.  Alkaline phosphatase 77.  BNP is 285.  Arterial blood pH is 7.4, pCO2 is 40, pO2 is 63, bicarbonate is 25, and  oxygen saturation is 92.  Microscopic urine showing rare bacteria.  Urinalysis was negative for nitrate and leukocyte esterase.  Cardiac  markers showing troponin less than 0.05, myoglobin is 141, and CK-MB is  1.5.  PTT is 22 within normal, PT is 12.9, and INR is 1.0.  White blood  count is 7.5, hemoglobin is 14.5, hematocrit is 43.8, and platelet count  is 233.   ASSESSMENT:  1. Pulmonary edema.  It is really unclear the etiology at this time,      likely cardiogenic (R/O MI)versus metabolic.  2. Hyperglycemia with possible new onset diabetes.  No evidence of      acidosis at this time.  3. Hypokalemia.   PLAN:  1. We will admit [name] to the ICU.  2. We will start aggressive diuresis.  We will obtain CT chest if no      movement with the aggressive diuresis.  3. We will  continue BiPAP for now.  We will obtain ABG after 3 hours      on the BiPAP.  4. We will obtain 2-D echo on a stat basis if possible.  5. Repeat cardiac enzymes stat and repeat EKG stat.  6. We will start the patient on nitroglycerin drip.  7. For the hyperglycemia, the patient does not have acidosis or      significant encephalopathy with extremely elevated number of blood      glucose.  I will start the patient on IV insulin as per insulin      protocol.  We will obtain A1c and C-peptide.  8. Hypokalemia.  We will replete intravenously.  9. best practice:  We will consider IV Protonix for GI prophylaxis and      subcutaneous heparin for DVT prophylaxis.      Beckey Rutter, MD  Electronically Signed     EME/MEDQ  D:  06/07/2009  T:  06/07/2009  Job:  [id number]

## 2015-05-05 ENCOUNTER — Emergency Department (HOSPITAL_BASED_OUTPATIENT_CLINIC_OR_DEPARTMENT_OTHER)
Admission: EM | Admit: 2015-05-05 | Discharge: 2015-05-05 | Disposition: A | Discharge status: Home / Self Care | Payer: Medicare Other | Attending: Emergency Medicine | Admitting: Emergency Medicine

## 2015-05-05 ENCOUNTER — Other Ambulatory Visit: Payer: Self-pay

## 2015-05-05 ENCOUNTER — Encounter (HOSPITAL_BASED_OUTPATIENT_CLINIC_OR_DEPARTMENT_OTHER): Payer: Self-pay

## 2015-05-05 DIAGNOSIS — Z79899 Other long term (current) drug therapy: Secondary | ICD-10-CM | POA: Insufficient documentation

## 2015-05-05 DIAGNOSIS — Z9889 Other specified postprocedural states: Secondary | ICD-10-CM | POA: Insufficient documentation

## 2015-05-05 DIAGNOSIS — E1165 Type 2 diabetes mellitus with hyperglycemia: Secondary | ICD-10-CM | POA: Diagnosis not present

## 2015-05-05 DIAGNOSIS — I10 Essential (primary) hypertension: Secondary | ICD-10-CM | POA: Diagnosis not present

## 2015-05-05 DIAGNOSIS — N289 Disorder of kidney and ureter, unspecified: Secondary | ICD-10-CM | POA: Insufficient documentation

## 2015-05-05 DIAGNOSIS — Z7951 Long term (current) use of inhaled steroids: Secondary | ICD-10-CM | POA: Diagnosis not present

## 2015-05-05 DIAGNOSIS — I252 Old myocardial infarction: Secondary | ICD-10-CM | POA: Diagnosis not present

## 2015-05-05 DIAGNOSIS — Z7982 Long term (current) use of aspirin: Secondary | ICD-10-CM | POA: Diagnosis not present

## 2015-05-05 DIAGNOSIS — R739 Hyperglycemia, unspecified: Secondary | ICD-10-CM

## 2015-05-05 DIAGNOSIS — E785 Hyperlipidemia, unspecified: Secondary | ICD-10-CM | POA: Insufficient documentation

## 2015-05-05 DIAGNOSIS — R112 Nausea with vomiting, unspecified: Secondary | ICD-10-CM

## 2015-05-05 HISTORY — DX: Disorder of kidney and ureter, unspecified: N28.9

## 2015-05-05 HISTORY — DX: Essential (primary) hypertension: I10

## 2015-05-05 HISTORY — DX: Acute myocardial infarction, unspecified: I21.9

## 2015-05-05 HISTORY — DX: Type 2 diabetes mellitus without complications: E11.9

## 2015-05-05 HISTORY — DX: Hyperlipidemia, unspecified: E78.5

## 2015-05-05 LAB — CBC WITH DIFFERENTIAL/PLATELET
BASOS ABS: 0 10*3/uL (ref 0.0–0.1)
Basophils Relative: 1 % (ref 0–1)
EOS PCT: 5 % (ref 0–5)
Eosinophils Absolute: 0.3 10*3/uL (ref 0.0–0.7)
HEMATOCRIT: 35.5 % — AB (ref 36.0–46.0)
HEMOGLOBIN: 11.6 g/dL — AB (ref 12.0–15.0)
LYMPHS ABS: 1.6 10*3/uL (ref 0.7–4.0)
LYMPHS PCT: 30 % (ref 12–46)
MCH: 25.8 pg — ABNORMAL LOW (ref 26.0–34.0)
MCHC: 32.7 g/dL (ref 30.0–36.0)
MCV: 78.9 fL (ref 78.0–100.0)
MONO ABS: 0.4 10*3/uL (ref 0.1–1.0)
MONOS PCT: 7 % (ref 3–12)
NEUTROS PCT: 57 % (ref 43–77)
Neutro Abs: 3.1 10*3/uL (ref 1.7–7.7)
Platelets: 169 10*3/uL (ref 150–400)
RBC: 4.5 MIL/uL (ref 3.87–5.11)
RDW: 15 % (ref 11.5–15.5)
WBC: 5.4 10*3/uL (ref 4.0–10.5)

## 2015-05-05 LAB — COMPREHENSIVE METABOLIC PANEL
ALT: 19 U/L (ref 14–54)
AST: 17 U/L (ref 15–41)
Albumin: 3.8 g/dL (ref 3.5–5.0)
Alkaline Phosphatase: 62 U/L (ref 38–126)
Anion gap: 8 (ref 5–15)
BILIRUBIN TOTAL: 0.6 mg/dL (ref 0.3–1.2)
BUN: 26 mg/dL — AB (ref 6–20)
CALCIUM: 8.7 mg/dL — AB (ref 8.9–10.3)
CO2: 27 mmol/L (ref 22–32)
CREATININE: 1.35 mg/dL — AB (ref 0.44–1.00)
Chloride: 104 mmol/L (ref 101–111)
GFR, EST AFRICAN AMERICAN: 48 mL/min — AB (ref >60)
GFR, EST NON AFRICAN AMERICAN: 42 mL/min — AB (ref >60)
Glucose, Bld: 286 mg/dL — ABNORMAL HIGH (ref 65–99)
Potassium: 4 mmol/L (ref 3.5–5.1)
Sodium: 139 mmol/L (ref 135–145)
Total Protein: 7.7 g/dL (ref 6.5–8.1)

## 2015-05-05 LAB — CBG MONITORING, ED: GLUCOSE-CAPILLARY: 200 mg/dL — AB (ref 65–99)

## 2015-05-05 LAB — URINALYSIS, ROUTINE W REFLEX MICROSCOPIC
Bilirubin Urine: NEGATIVE
GLUCOSE, UA: 500 mg/dL — AB
Ketones, ur: NEGATIVE mg/dL
Leukocytes, UA: NEGATIVE
Nitrite: NEGATIVE
PH: 6 (ref 5.0–8.0)
Protein, ur: NEGATIVE mg/dL
Specific Gravity, Urine: 1.014 (ref 1.005–1.030)
Urobilinogen, UA: 1 mg/dL (ref 0.0–1.0)

## 2015-05-05 LAB — URINE MICROSCOPIC-ADD ON

## 2015-05-05 LAB — TROPONIN I: Troponin I: <0.03 ng/mL (ref <0.031)

## 2015-05-05 MED ORDER — SODIUM CHLORIDE 0.9 % IV BOLUS (SEPSIS)
1000.0000 mL | Freq: Once | INTRAVENOUS | Status: AC
  Administered 2015-05-05: 1000 mL via INTRAVENOUS

## 2015-05-05 MED ORDER — ONDANSETRON HCL 4 MG PO TABS: 4.0000 mg | ORAL_TABLET | Freq: Four times a day (QID) | ORAL | Status: AC

## 2015-05-05 MED ORDER — ONDANSETRON HCL 4 MG/2ML IJ SOLN
4.0000 mg | Freq: Once | INTRAMUSCULAR | Status: AC
  Administered 2015-05-05: 4 mg via INTRAVENOUS
  Filled 2015-05-05: qty 2

## 2015-05-05 NOTE — ED Notes (Signed)
MD at bedside. 

## 2015-05-05 NOTE — ED Notes (Signed)
Patient assisted into changing into a gown from waist up.

## 2015-05-05 NOTE — ED Notes (Signed)
Pt has not had any vomiting after being given water.

## 2015-05-05 NOTE — ED Provider Notes (Signed)
CSN: [id number]     Arrival date & time 05/05/15  7893 History   First MD Initiated Contact with Patient 05/05/15 1009     Chief Complaint  Patient presents with  . Nausea  . Weakness     (Consider location/radiation/quality/duration/timing/severity/associated sxs/prior Treatment) HPI  Pt presenting with c/o feeling nauseated and emesis x 1 this morning.  She was feeling well yesterday, symptoms began when she awoke this morning.  No abdominal pain, no diarrhea.  She does feel weaker than her usual.  She states she has been eating and drinking normally.  No chest pain or difficulty breathing.  No dysuria.  No syncope, no lightheadedness or vertigo.  No sick contacts.  She has not had any treatment prior to arrival.  There are no other associated systemic symptoms, there are no other alleviating or modifying factors.   Past Medical History  Diagnosis Date  . Diabetes mellitus without complication   . MI (myocardial infarction)   . Hypertension   . Renal disorder   . Hyperlipidemia    Past Surgical History  Procedure Laterality Date  . Cardiac surgery     No family history on file. History  Substance Use Topics  . Smoking status: Never Smoker   . Smokeless tobacco: Not on file  . Alcohol Use: No   OB History    No data available     Review of Systems  ROS reviewed and all otherwise negative except for mentioned in HPI    Allergies  Review of patient's allergies indicates no known allergies.  Home Medications   Prior to Admission medications   Medication Sig Start Date End Date Taking? Authorizing Provider  aspirin 325 MG tablet Take 325 mg by mouth daily.   Yes Historical Provider, MD  carvedilol (COREG) 25 MG tablet Take 25 mg by mouth 2 (two) times daily with a meal.   Yes Historical Provider, MD  Iron-Vitamin C 65-125 MG TABS Take by mouth.   Yes Historical Provider, MD  linagliptin (TRADJENTA) 5 MG TABS tablet Take 5 mg by mouth daily.   Yes Historical Provider,  MD  omeprazole (PRILOSEC) 20 MG capsule Take 20 mg by mouth daily.   Yes Historical Provider, MD  quinapril (ACCUPRIL) 5 MG tablet Take 2.5 mg by mouth at bedtime.   Yes Historical Provider, MD  simvastatin (ZOCOR) 10 MG tablet Take 10 mg by mouth daily.   Yes Historical Provider, MD  triamcinolone (NASACORT) 55 MCG/ACT AERO nasal inhaler Place 2 sprays into the nose daily.   Yes Historical Provider, MD  ondansetron (ZOFRAN) 4 MG tablet Take 1 tablet (4 mg total) by mouth every 6 (six) hours. 05/05/15   Jerelyn Scott, MD   BP 142/70 mmHg  Pulse 68  Temp(Src) 98.2 F (36.8 C) (Oral)  Resp 15  Ht  (1.676 m)  Wt 190 lb (86.183 kg)  BMI 30.68 kg/m2  SpO2 99%  Vitals reviewed Physical Exam  Physical Examination: General appearance - alert, well appearing, and in no distress Mental status - alert, oriented to person, place, and time Eyes -no conjunctival injection, no scleral icterus Mouth - mucous membranes moist, pharynx normal without lesions Chest - clear to auscultation, no wheezes, rales or rhonchi, symmetric air entry Heart - normal rate, regular rhythm, normal S1, S2, no murmurs, rubs, clicks or gallops Abdomen - soft, nontender, nondistended, no masses or organomegaly Neurological - alert, oriented, normal speech, strength 5/5 in extremities x 4, sensation intact Extremities - peripheral pulses  normal, no pedal edema, no clubbing or cyanosis Skin - normal coloration and turgor, no rashes  ED Course  Procedures (including critical care time) Labs Review Labs Reviewed  CBC WITH DIFFERENTIAL/PLATELET - Abnormal; Notable for the following:    Hemoglobin 11.6 (*)    HCT 35.5 (*)    MCH 25.8 (*)    All other components within normal limits  COMPREHENSIVE METABOLIC PANEL - Abnormal; Notable for the following:    Glucose, Bld 286 (*)    BUN 26 (*)    Creatinine, Ser 1.35 (*)    Calcium 8.7 (*)    GFR calc non Af Amer 42 (*)    GFR calc Af Amer 48 (*)    All other  components within normal limits  URINALYSIS, ROUTINE W REFLEX MICROSCOPIC (NOT [facility]) - Abnormal; Notable for the following:    Glucose, UA 500 (*)    Hgb urine dipstick TRACE (*)    All other components within normal limits  URINE MICROSCOPIC-ADD ON - Abnormal; Notable for the following:    Squamous Epithelial / LPF FEW (*)    Bacteria, UA MANY (*)    All other components within normal limits  CBG MONITORING, ED - Abnormal; Notable for the following:    Glucose-Capillary 200 (*)    All other components within normal limits  TROPONIN I    Imaging Review No results found.   EKG Interpretation   Date/Time:  Tuesday May 05 2015 10:26:01 EDT Ventricular Rate:  71 PR Interval:  150 QRS Duration: 80 QT Interval:  420 QTC Calculation: 456 R Axis:   25 Text Interpretation:  Normal sinus rhythm Nonspecific ST and T wave  abnormality Abnormal ECG No significant change since last tracing  Confirmed by [facility]  MD, Bonny Vanleeuwen [phone]([id number]) on 05/05/2015 10:49:22 AM      MDM   Final diagnoses:  Nausea and vomiting, vomiting of unspecified type  Hyperglycemia  Renal insufficiency    Pt presenting with co nausea and emesis with associated weakness.  Workup shows reassuring EKG, labs reassuring with the exception of mild renal insufficiency- increased from last value from 2010, hyperglycemia which improved after IV hydration.  Not in DKA.  No signs of UTI.  Pt feeling improved after fluids and nausea meds, able to tolerate po fluids in the ED.  All results d/w patient and family members at bedside.  Discharged with strict return precautions.  Pt agreeable with plan.  Nursing notes including past medical history and social history reviewed and considered in documentation Prior records reviewed and considered during this visit     Jerelyn ScottMartha Linker, MD 05/05/15 1348

## 2015-05-05 NOTE — ED Notes (Signed)
Patient preparing for discharge. 

## 2015-05-05 NOTE — ED Notes (Signed)
Pt reports nausea and weakness that started this AM. Denies abdominal pain, urinary s/s, cough, fever, sick contacts.

## 2015-05-05 NOTE — Discharge Instructions (Signed)
Return to the ED with any concerns including vomiting and not able to keep down liquids, abdominal pain, chest pain, difficulty breathing, decreased level of alertness/lethargy, or any other alarming symptoms  It is important that you drink plenty of fluids and have your kidney blood work rechecked by your doctor in the next week

## 2015-05-05 NOTE — ED Notes (Signed)
Pt given water at this time
